# Patient Record
Sex: Female | Born: 1970 | Race: White | Hispanic: No | Marital: Married | Smoking: Never smoker
Health system: Southern US, Community
[De-identification: ages and names within clinical notes are randomized; demographics above are authoritative.]

## PROBLEM LIST (undated history)

## (undated) DIAGNOSIS — B0229 Other postherpetic nervous system involvement: Secondary | ICD-10-CM

## (undated) DIAGNOSIS — E039 Hypothyroidism, unspecified: Secondary | ICD-10-CM

## (undated) DIAGNOSIS — K2970 Gastritis, unspecified, without bleeding: Secondary | ICD-10-CM

## (undated) DIAGNOSIS — K219 Gastro-esophageal reflux disease without esophagitis: Secondary | ICD-10-CM

## (undated) DIAGNOSIS — R030 Elevated blood-pressure reading, without diagnosis of hypertension: Secondary | ICD-10-CM

## (undated) DIAGNOSIS — J309 Allergic rhinitis, unspecified: Secondary | ICD-10-CM

## (undated) DIAGNOSIS — B029 Zoster without complications: Secondary | ICD-10-CM

## (undated) DIAGNOSIS — E785 Hyperlipidemia, unspecified: Secondary | ICD-10-CM

## (undated) DIAGNOSIS — G4733 Obstructive sleep apnea (adult) (pediatric): Secondary | ICD-10-CM

## (undated) DIAGNOSIS — C73 Malignant neoplasm of thyroid gland: Secondary | ICD-10-CM

## (undated) DIAGNOSIS — R790 Abnormal level of blood mineral: Secondary | ICD-10-CM

## (undated) DIAGNOSIS — N83209 Unspecified ovarian cyst, unspecified side: Secondary | ICD-10-CM

## (undated) HISTORY — DX: Allergic rhinitis, unspecified: J30.9

## (undated) HISTORY — DX: Elevated blood-pressure reading, without diagnosis of hypertension: R03.0

## (undated) HISTORY — PX: BREAST REDUCTION SURGERY: SHX8

## (undated) HISTORY — DX: Gastritis, unspecified, without bleeding: K29.70

## (undated) HISTORY — DX: Hypothyroidism, unspecified: E03.9

## (undated) HISTORY — DX: Unspecified ovarian cyst, unspecified side: N83.209

## (undated) HISTORY — DX: Malignant neoplasm of thyroid gland: C73

## (undated) HISTORY — DX: Obstructive sleep apnea (adult) (pediatric): G47.33

## (undated) HISTORY — DX: Hyperlipidemia, unspecified: E78.5

## (undated) HISTORY — DX: Abnormal level of blood mineral: R79.0

## (undated) HISTORY — PX: COLONOSCOPY: SHX174

## (undated) HISTORY — DX: Gastro-esophageal reflux disease without esophagitis: K21.9

## (undated) HISTORY — DX: Other postherpetic nervous system involvement: B02.29

## (undated) HISTORY — DX: Zoster without complications: B02.9

---

## 2002-08-29 HISTORY — PX: THYROIDECTOMY: SHX17

## 2002-09-16 ENCOUNTER — Encounter: Payer: Self-pay | Admitting: Family Medicine

## 2004-10-06 ENCOUNTER — Other Ambulatory Visit: Admission: RE | Admit: 2004-10-06 | Discharge: 2004-10-06 | Payer: Self-pay | Admitting: Obstetrics and Gynecology

## 2004-12-12 ENCOUNTER — Encounter: Admission: RE | Admit: 2004-12-12 | Discharge: 2004-12-12 | Payer: Self-pay | Admitting: Otolaryngology

## 2005-04-09 ENCOUNTER — Encounter (HOSPITAL_COMMUNITY): Admission: RE | Admit: 2005-04-09 | Discharge: 2005-07-08 | Payer: Self-pay | Admitting: Family Medicine

## 2005-06-12 ENCOUNTER — Ambulatory Visit: Payer: Self-pay

## 2005-06-27 ENCOUNTER — Ambulatory Visit: Payer: Self-pay

## 2005-06-29 ENCOUNTER — Ambulatory Visit: Payer: Self-pay

## 2005-07-03 ENCOUNTER — Ambulatory Visit: Payer: Self-pay

## 2005-07-29 ENCOUNTER — Ambulatory Visit: Payer: Self-pay

## 2005-08-07 ENCOUNTER — Encounter: Payer: Self-pay | Admitting: Family Medicine

## 2005-08-07 ENCOUNTER — Encounter: Admission: RE | Admit: 2005-08-07 | Discharge: 2005-08-07 | Payer: Self-pay | Admitting: Obstetrics and Gynecology

## 2005-10-11 ENCOUNTER — Other Ambulatory Visit: Admission: RE | Admit: 2005-10-11 | Discharge: 2005-10-11 | Payer: Self-pay | Admitting: Obstetrics and Gynecology

## 2006-02-19 ENCOUNTER — Ambulatory Visit: Payer: Self-pay | Admitting: Internal Medicine

## 2006-03-14 ENCOUNTER — Ambulatory Visit: Payer: Self-pay

## 2006-04-15 ENCOUNTER — Ambulatory Visit: Payer: Self-pay

## 2006-05-27 ENCOUNTER — Ambulatory Visit: Payer: Self-pay

## 2006-08-16 ENCOUNTER — Ambulatory Visit: Payer: Self-pay

## 2006-10-07 IMAGING — US THYROID ULTRASOUND
1 series · 10 of 10 positions shown · non-contrast
Comparison: none

REASON FOR EXAM: thyroid CA
COMMENTS:

[Series 1: thyroid ultrasound · 10 of 10 slices shown]
[im 1/10]
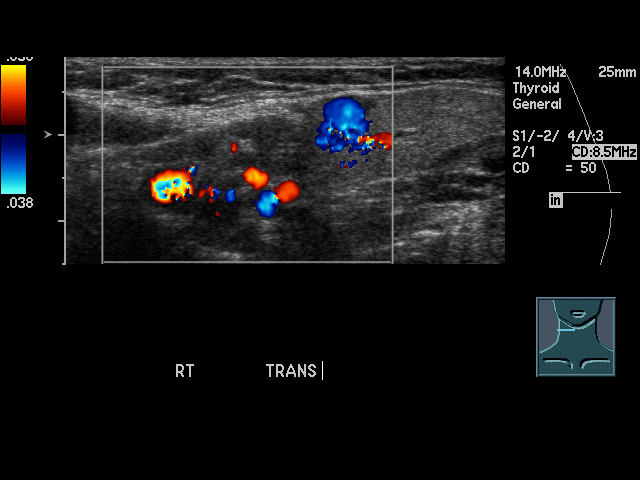
[im 2/10]
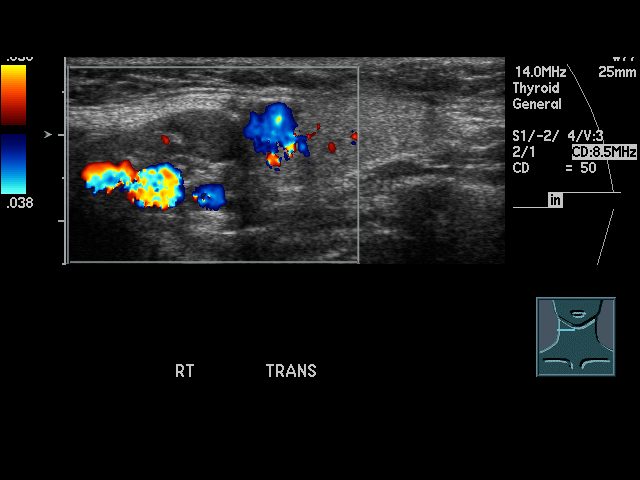
[im 3/10]
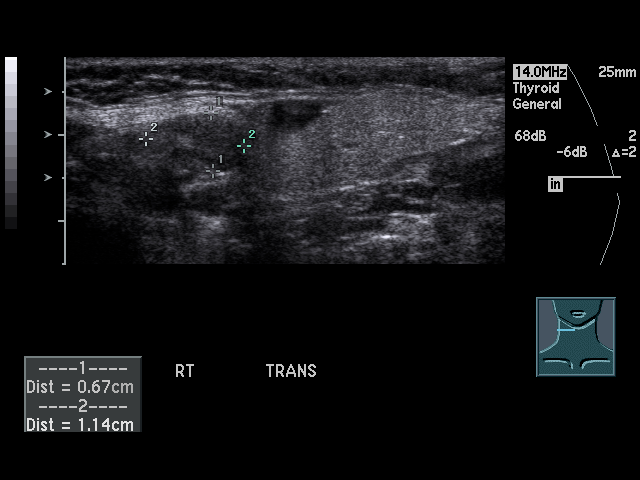
[im 4/10]
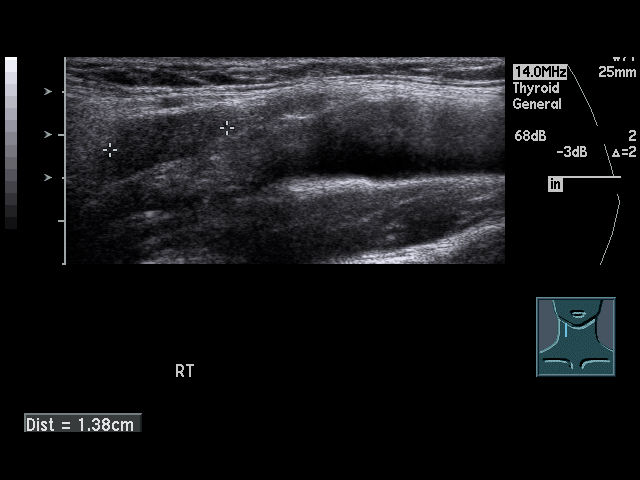
[im 5/10]
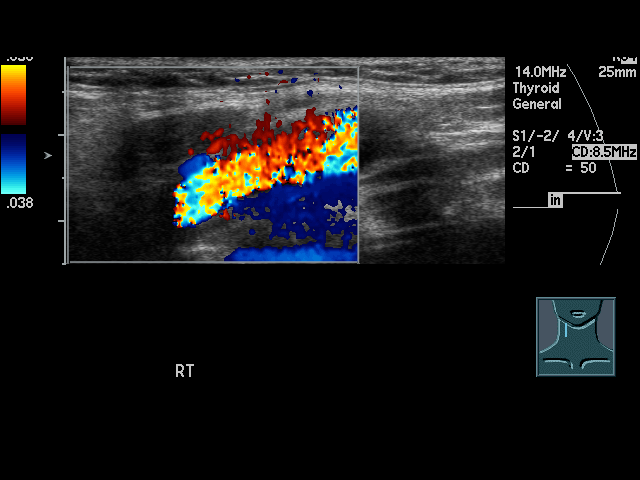
[im 6/10]
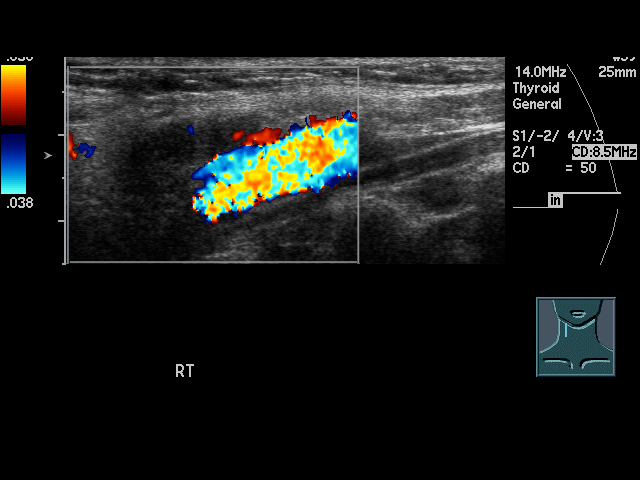
[im 7/10]
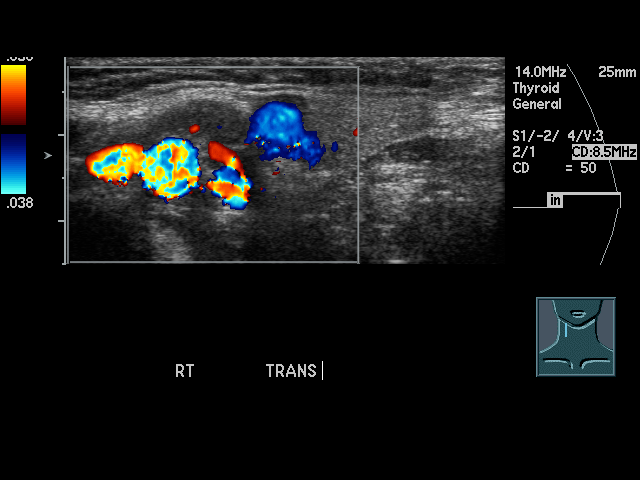
[im 8/10]
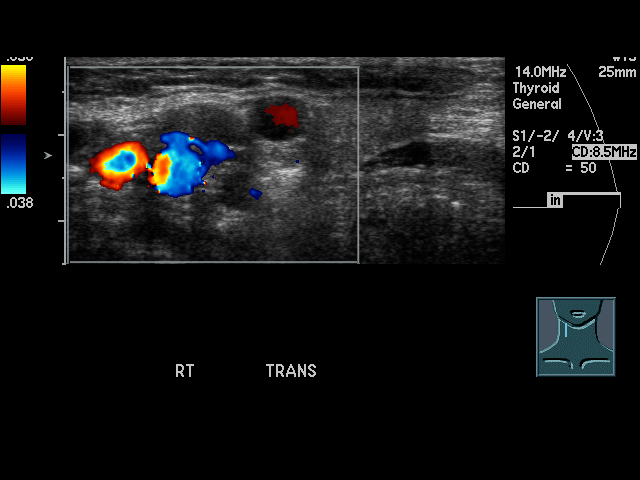
[im 9/10]
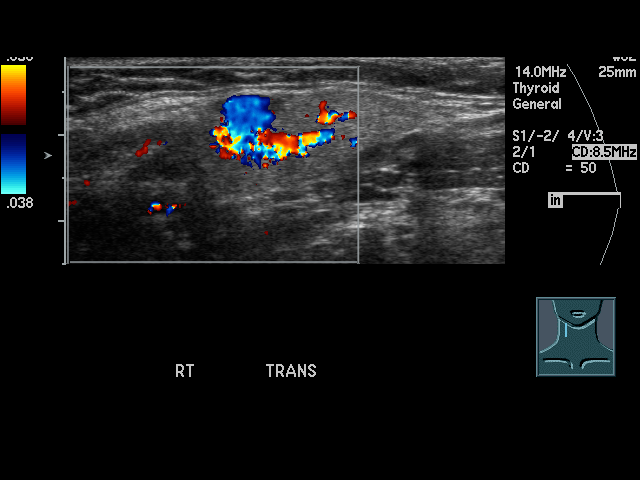
[im 10/10]
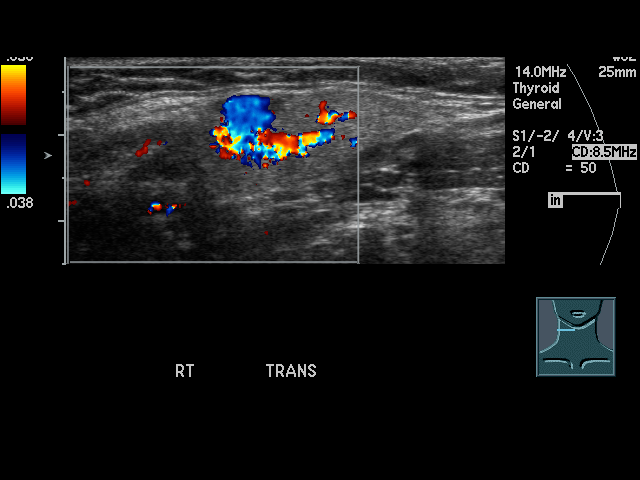

[10 of 10 positions shown; findings below may reference images not displayed]

PROCEDURE:     US  - US SOFT TISSUE HEAD/NECK  - April 15, 2006 [DATE]

RESULT:     The previously noted submandibular nodule on the RIGHT is again
seen and on this exam measures 1.38 cm x 1.14 cm x 0.67 cm.  These values
are essentially unchanged allowing for possible differences in measurement
technique in comparison to the exam June 27, 2005.  On a few views
there is noted a bright echogenic area within this nodule, which could
represent fat.  Note is made that fat was observed within the nodule at
prior CT, which would be consistent with lymph node.  The margins remain
smooth.
IMPRESSION: 1)Stable appearing submandibular nodule on the RIGHT, most likely
representing a lymph node.

## 2007-02-13 ENCOUNTER — Ambulatory Visit: Payer: Self-pay

## 2007-03-20 ENCOUNTER — Ambulatory Visit: Payer: Self-pay

## 2007-08-22 ENCOUNTER — Ambulatory Visit: Payer: Self-pay | Admitting: *Deleted

## 2008-04-08 ENCOUNTER — Encounter: Payer: Self-pay | Admitting: Family Medicine

## 2008-04-27 ENCOUNTER — Ambulatory Visit: Payer: Self-pay | Admitting: Unknown Physician Specialty

## 2008-06-29 ENCOUNTER — Ambulatory Visit: Payer: Self-pay | Admitting: Internal Medicine

## 2008-07-09 ENCOUNTER — Ambulatory Visit: Payer: Self-pay | Admitting: Internal Medicine

## 2008-07-29 ENCOUNTER — Ambulatory Visit: Payer: Self-pay | Admitting: Internal Medicine

## 2008-08-19 ENCOUNTER — Ambulatory Visit: Payer: Self-pay | Admitting: Obstetrics and Gynecology

## 2008-08-23 ENCOUNTER — Ambulatory Visit: Payer: Self-pay | Admitting: Obstetrics and Gynecology

## 2008-08-29 ENCOUNTER — Ambulatory Visit: Payer: Self-pay | Admitting: Internal Medicine

## 2008-09-28 ENCOUNTER — Ambulatory Visit: Payer: Self-pay | Admitting: Internal Medicine

## 2008-10-29 ENCOUNTER — Ambulatory Visit: Payer: Self-pay | Admitting: Internal Medicine

## 2008-11-04 ENCOUNTER — Encounter: Payer: Self-pay | Admitting: Family Medicine

## 2009-07-12 ENCOUNTER — Encounter: Payer: Self-pay | Admitting: Family Medicine

## 2009-07-12 ENCOUNTER — Ambulatory Visit: Payer: Self-pay | Admitting: Internal Medicine

## 2009-07-26 ENCOUNTER — Ambulatory Visit: Payer: Self-pay | Admitting: Family Medicine

## 2009-07-26 DIAGNOSIS — Z87898 Personal history of other specified conditions: Secondary | ICD-10-CM | POA: Insufficient documentation

## 2009-07-26 DIAGNOSIS — J309 Allergic rhinitis, unspecified: Secondary | ICD-10-CM | POA: Insufficient documentation

## 2009-07-26 DIAGNOSIS — B0229 Other postherpetic nervous system involvement: Secondary | ICD-10-CM | POA: Insufficient documentation

## 2009-07-26 DIAGNOSIS — E039 Hypothyroidism, unspecified: Secondary | ICD-10-CM | POA: Insufficient documentation

## 2009-07-26 DIAGNOSIS — C73 Malignant neoplasm of thyroid gland: Secondary | ICD-10-CM | POA: Insufficient documentation

## 2009-07-26 HISTORY — DX: Other postherpetic nervous system involvement: B02.29

## 2009-07-26 HISTORY — DX: Allergic rhinitis, unspecified: J30.9

## 2009-07-26 HISTORY — DX: Hypothyroidism, unspecified: E03.9

## 2009-07-26 HISTORY — DX: Malignant neoplasm of thyroid gland: C73

## 2009-07-29 ENCOUNTER — Ambulatory Visit: Payer: Self-pay | Admitting: Internal Medicine

## 2009-08-22 ENCOUNTER — Ambulatory Visit: Payer: Self-pay | Admitting: Family Medicine

## 2009-08-22 DIAGNOSIS — K219 Gastro-esophageal reflux disease without esophagitis: Secondary | ICD-10-CM

## 2009-08-22 DIAGNOSIS — R030 Elevated blood-pressure reading, without diagnosis of hypertension: Secondary | ICD-10-CM | POA: Insufficient documentation

## 2009-08-22 DIAGNOSIS — J019 Acute sinusitis, unspecified: Secondary | ICD-10-CM | POA: Insufficient documentation

## 2009-08-22 HISTORY — DX: Gastro-esophageal reflux disease without esophagitis: K21.9

## 2009-08-22 HISTORY — DX: Elevated blood-pressure reading, without diagnosis of hypertension: R03.0

## 2009-10-19 ENCOUNTER — Ambulatory Visit: Payer: Self-pay | Admitting: Internal Medicine

## 2009-10-29 ENCOUNTER — Ambulatory Visit: Payer: Self-pay | Admitting: Internal Medicine

## 2009-12-16 ENCOUNTER — Telehealth: Payer: Self-pay | Admitting: Family Medicine

## 2009-12-16 ENCOUNTER — Ambulatory Visit: Payer: Self-pay | Admitting: Family Medicine

## 2009-12-16 DIAGNOSIS — J1189 Influenza due to unidentified influenza virus with other manifestations: Secondary | ICD-10-CM | POA: Insufficient documentation

## 2009-12-16 LAB — CONVERTED CEMR LAB: Inflenza A Ag: POSITIVE

## 2010-07-28 ENCOUNTER — Ambulatory Visit: Payer: Self-pay | Admitting: Obstetrics and Gynecology

## 2010-09-13 ENCOUNTER — Telehealth: Payer: Self-pay | Admitting: Family Medicine

## 2010-10-06 ENCOUNTER — Emergency Department (HOSPITAL_COMMUNITY)
Admission: EM | Admit: 2010-10-06 | Discharge: 2010-10-06 | Payer: Self-pay | Source: Home / Self Care | Admitting: Emergency Medicine

## 2010-10-27 ENCOUNTER — Ambulatory Visit
Admission: RE | Admit: 2010-10-27 | Discharge: 2010-10-27 | Payer: Self-pay | Source: Home / Self Care | Attending: Family Medicine | Admitting: Family Medicine

## 2010-11-03 LAB — CONVERTED CEMR LAB
Basophils Absolute: 0 10*3/uL (ref 0.0–0.1)
Basophils Relative: 0.2 % (ref 0.0–3.0)
Eosinophils Absolute: 0.1 10*3/uL (ref 0.0–0.7)
Eosinophils Relative: 1.1 % (ref 0.0–5.0)
HCT: 38.2 % (ref 36.0–46.0)
Hemoglobin: 12.9 g/dL (ref 12.0–15.0)
Lymphocytes Relative: 29.3 % (ref 12.0–46.0)
Lymphs Abs: 2.3 10*3/uL (ref 0.7–4.0)
MCHC: 33.8 g/dL (ref 30.0–36.0)
MCV: 89.8 fL (ref 78.0–100.0)
Monocytes Absolute: 0.5 10*3/uL (ref 0.1–1.0)
Monocytes Relative: 6.7 % (ref 3.0–12.0)
Neutro Abs: 4.9 10*3/uL (ref 1.4–7.7)
Neutrophils Relative %: 62.7 % (ref 43.0–77.0)
Platelets: 285 10*3/uL (ref 150.0–400.0)
RBC: 4.26 M/uL (ref 3.87–5.11)
RDW: 13.3 % (ref 11.5–14.6)
TSH: 0.14 microintl units/mL — ABNORMAL LOW (ref 0.35–5.50)
WBC: 7.9 10*3/uL (ref 4.5–10.5)

## 2010-11-18 ENCOUNTER — Encounter (HOSPITAL_COMMUNITY): Payer: Self-pay | Admitting: Obstetrics and Gynecology

## 2010-11-19 ENCOUNTER — Encounter: Payer: Self-pay | Admitting: Family Medicine

## 2010-11-24 ENCOUNTER — Telehealth: Payer: Self-pay | Admitting: Family Medicine

## 2010-11-28 NOTE — Progress Notes (Signed)
Summary: shingles  Phone Note Call from Patient   Caller: Patient Call For: Nichole Salisbury MD Summary of Call: Pt. is asking if it is possible to have shingles x 2.  She has all the same symptoms as before, but no rash as of yet.  Would like to be seen if possible. [phone] Initial call taken by: [facility] CMA AAMA,  September 13, 2010 10:48 AM  Follow-up for Phone Call        yes it is possible to have this multiple times. work her in this afternoon  Follow-up by: Nichole Salisbury MD,  September 13, 2010 12:46 PM  Additional Follow-up for Phone Call Additional follow up Details #1::        [facility] Additional Follow-up by: [facility] CMA AAMA,  September 13, 2010 12:54 PM    Additional Follow-up for Phone Call Additional follow up Details #2::    Pt. prefers to wait and see how she does. Follow-up by: Lynann Beaver CMA AAMA,  September 13, 2010 1:43 PM

## 2010-11-28 NOTE — Assessment & Plan Note (Signed)
Summary: fever, cough   Vital Signs:  Patient profile:   40 year old female Temp:     99.0 degrees F oral BP sitting:   116 / 82  (left arm) Cuff size:   large  Vitals Entered By: Alfred Levins, CMA (December 16, 2009 9:40 AM) CC: fever, st, pressure around sinuses, cough   History of Present Illness: Here for 3 days of fevers, stuffy head, body aches, and a dry cough. No NVD. On fluids and Advil.   Current Medications (verified): 1)  Synthroid 175 Mcg Tabs (Levothyroxine Sodium) .... One By Mouth Daily 2)  Zyrtec Allergy 10 Mg Caps (Cetirizine Hcl) .... One By Mouth Daily 3)  Prevacid 30 Mg Cpdr (Lansoprazole) .... One By Mouth Daily  Allergies (verified): No Known Drug Allergies  Past History:  Past Medical History: Reviewed history from 07/26/2009 and no changes required. thyroid cancer, papillary carcinoma Hypothyroidism, sees Dr. Dorisann Frames chickenpox colon polyps sees Dr. Zelphia Cairo for GYN care Allergic rhinitis vitamin D deficiency, treated in 2010  Review of Systems  The patient denies anorexia, weight loss, weight gain, vision loss, decreased hearing, hoarseness, chest pain, syncope, dyspnea on exertion, peripheral edema, hemoptysis, abdominal pain, melena, hematochezia, severe indigestion/heartburn, hematuria, incontinence, genital sores, muscle weakness, suspicious skin lesions, transient blindness, difficulty walking, depression, unusual weight change, abnormal bleeding, enlarged lymph nodes, angioedema, breast masses, and testicular masses.    Physical Exam  General:  Well-developed,well-nourished,in no acute distress; alert,appropriate and cooperative throughout examination Head:  Normocephalic and atraumatic without obvious abnormalities. No apparent alopecia or balding. Eyes:  No corneal or conjunctival inflammation noted. EOMI. Perrla. Funduscopic exam benign, without hemorrhages, exudates or papilledema. Vision grossly normal. Ears:  External  ear exam shows no significant lesions or deformities.  Otoscopic examination reveals clear canals, tympanic membranes are intact bilaterally without bulging, retraction, inflammation or discharge. Hearing is grossly normal bilaterally. Nose:  External nasal examination shows no deformity or inflammation. Nasal mucosa are pink and moist without lesions or exudates. Mouth:  Oral mucosa and oropharynx without lesions or exudates.  Teeth in good repair. Neck:  No deformities, masses, or tenderness noted. Lungs:  Normal respiratory effort, chest expands symmetrically. Lungs are clear to auscultation, no crackles or wheezes.   Impression & Recommendations:  Problem # 1:  INFLUENZA (ICD-487.8)  Complete Medication List: 1)  Synthroid 175 Mcg Tabs (Levothyroxine sodium) .... One by mouth daily 2)  Zyrtec Allergy 10 Mg Caps (Cetirizine hcl) .... One by mouth daily 3)  Prevacid 30 Mg Cpdr (Lansoprazole) .... One by mouth daily 4)  Tamiflu 75 Mg Caps (Oseltamivir phosphate) .... Two times a day  Other Orders: Rapid Strep (19147) Flu A+B (82956)  Patient Instructions: 1)  Add Tamiflu.  2)  Please schedule a follow-up appointment as needed .  Prescriptions: TAMIFLU 75 MG CAPS (OSELTAMIVIR PHOSPHATE) two times a day  #10 x 0   Entered and Authorized by:   Nelwyn Salisbury MD   Signed by:   Nelwyn Salisbury MD on 12/16/2009   Method used:   Electronically to        [facility] [address] [phone]* (retail)       [address]       [place], [place]  [address]       Ph: [phone] or [phone]       Fax: [phone]   RxID:   [id number]   Laboratory Results  Other Tests  Rapid Strep: negative Influenza: positive Comments Wynona Canes, CMA  December 16, 2009 9:54 AM   Kit Test Internal QC: Negative   (Normal Range: Negative)

## 2010-11-28 NOTE — Progress Notes (Signed)
  Phone Note Call from Patient   Caller: Patient Call For: Nelwyn Salisbury MD Summary of Call: sick with fever 101.7, cough, wants to be seen. Initial call taken by: Raechel Ache, RN,  December 16, 2009 8:17 AM  Follow-up for Phone Call        appt this morning Dr Clent Ridges Follow-up by: Raechel Ache, RN,  December 16, 2009 8:17 AM

## 2010-11-30 NOTE — Progress Notes (Signed)
Summary: rx flagyl   Phone Note Call from Patient Call back at Home Phone [phone]   Caller: Patient Call For: Nelwyn Salisbury MD Summary of Call: Pt had GI symptoms on Keflex with cramping and diarrhea.  Stopped it yesterday am, and wants to know how long it will take her to feel better?  Initial call taken by: Lynann Beaver CMA AAMA,  November 24, 2010 9:50 AM  Follow-up for Phone Call        this should be gone by tomorrow. Why did she take Keflex?  Follow-up by: Nelwyn Salisbury MD,  November 24, 2010 1:37 PM  Additional Follow-up for Phone Call Additional follow up Details #1::        Has a friend that is a NP and she had a hang nail that she pulled off of her ring finger and it got infected, pt is concerned about getting cdiff.  She is taking imodium and its helping but she is still having pain.  Wants to call know if its ok with you if she has any problems then she can call you at home this weekend Additional Follow-up by: Alfred Levins, CMA,  November 24, 2010 3:42 PM    Additional Follow-up for Phone Call Additional follow up Details #2::    there is a real possibility of C. diff. so I would advise her to go ahead be treated for this now rather than later. Call in Flagyl 500 mg to take three times a day for 10 days.  Follow-up by: Nelwyn Salisbury MD,  November 24, 2010 4:57 PM  Additional Follow-up for Phone Call Additional follow up Details #3:: Details for Additional Follow-up Action Taken: spoke to pt  [facility] Additional Follow-up by: Pura Spice, RN,  November 24, 2010 4:59 PM  New/Updated Medications: FLAGYL 500 MG TABS (METRONIDAZOLE) 1 by mouth three times a day for 10 days Prescriptions: FLAGYL 500 MG TABS (METRONIDAZOLE) 1 by mouth three times a day for 10 days  #30 x 0   Entered by:   Pura Spice, RN   Authorized by:   Nelwyn Salisbury MD   Signed by:   Pura Spice, RN on 11/24/2010   Method used:   Electronically to        [facility]  [phone]* (retail)       [address]       [place], [place]  [address]       Ph: [phone] or [phone]       Fax: [phone]   RxID:   [id number]

## 2010-11-30 NOTE — Assessment & Plan Note (Signed)
Summary: flu shot//ccm  Nurse Visit   Review of Systems       Flu Vaccine Consent Questions     Do you have a history of severe allergic reactions to this vaccine? no    Any prior history of allergic reactions to egg and/or gelatin? no    Do you have a sensitivity to the preservative Thimersol? no    Do you have a past history of Guillan-Barre Syndrome? no    Do you currently have an acute febrile illness? no    Have you ever had a severe reaction to latex? no    Vaccine information given and explained to patient? yes    Are you currently pregnant? no    Lot Number:[id number]   Exp Date:04/28/2011   Site Given  Left Deltoid IM  sanofi pasteur Pura Spice, RN  October 27, 2010 2:59 PM     Allergies: No Known Drug Allergies  Orders Added: 1)  Admin 1st Vaccine [90471] 2)  Flu Vaccine 27yrs + [06301]  Appended Document: Orders Update    Clinical Lists Changes  Orders: Added new Service order of Venipuncture (60109) - Signed Added new Service order of Specimen Handling (32355) - Signed Added new Test order of TLB-CBC Platelet - w/Differential (85025-CBCD) - Signed Added new Test order of TLB-TSH (Thyroid Stimulating Hormone) (84443-TSH) - Signed

## 2010-12-28 ENCOUNTER — Ambulatory Visit: Payer: Self-pay | Admitting: Internal Medicine

## 2011-01-04 ENCOUNTER — Encounter: Payer: Self-pay | Admitting: Family Medicine

## 2011-01-05 ENCOUNTER — Ambulatory Visit (INDEPENDENT_AMBULATORY_CARE_PROVIDER_SITE_OTHER): Payer: PRIVATE HEALTH INSURANCE | Admitting: Family Medicine

## 2011-01-05 ENCOUNTER — Encounter: Payer: Self-pay | Admitting: Family Medicine

## 2011-01-05 VITALS — BP 104/70 | Temp 98.4°F | Ht 66.0 in

## 2011-01-05 DIAGNOSIS — E039 Hypothyroidism, unspecified: Secondary | ICD-10-CM

## 2011-01-05 DIAGNOSIS — F909 Attention-deficit hyperactivity disorder, unspecified type: Secondary | ICD-10-CM

## 2011-01-05 NOTE — Progress Notes (Signed)
  Subjective:    Patient ID: Nichole Flores, female    DOB: 04/03/1971, 40 y.o.   MRN: [id number]  HPI Here to discuss several issues. First she has been seeing Dr. Talmage Nap to regulate her thyroid levels, and some recent labs showed her TSH to be suppressed a bit at 0.096. She thinks her Synthroid dose may be too high. Over the past few months she has had occasional brief episodes of lightheadedness which pass after 5-10 seconds. No vertigo. These are not associated with any other symptoms, nor with exercise. She takes spinning classes several days a week, and these spells never bother her then. No SOB or chest pains or palpitations. Also, she has had some mild problems with focus over the years, and they have been a bit more pronounced lately. She tends to go from task to task quickly, but she always goes back to finish her unfinished tasks. She manages her activities well. She has learned that her mother and her mother's mother probably had ADHD from the family's descriptions.    Review of Systems  Constitutional: Negative.   HENT: Negative.   Eyes: Negative.   Respiratory: Negative.   Cardiovascular: Negative.   Neurological: Positive for light-headedness. Negative for tremors, syncope and weakness.  Psychiatric/Behavioral: Negative.        Objective:   Physical Exam  Constitutional: She is oriented to person, place, and time. She appears well-developed and well-nourished.  Eyes: Conjunctivae are normal. Pupils are equal, round, and reactive to light.  Neck: No thyromegaly present.  Cardiovascular: Normal rate, regular rhythm, normal heart sounds and intact distal pulses.   Pulmonary/Chest: Effort normal and breath sounds normal.  Lymphadenopathy:    She has no cervical adenopathy.  Neurological: She is alert and oriented to person, place, and time. No cranial nerve deficit. She exhibits normal muscle tone. Coordination normal.  Psychiatric: She has a normal mood and affect. Her  behavior is normal. Judgment and thought content normal.          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 Her spells of lightheadedness may in fact be related to overtreatment with thyroid meds. Advised her to see Dr. Talmage Nap soon to adjust the dose.

## 2011-01-28 ENCOUNTER — Ambulatory Visit: Payer: Self-pay | Admitting: Internal Medicine

## 2011-02-20 ENCOUNTER — Encounter: Payer: Self-pay | Admitting: Family Medicine

## 2011-03-02 ENCOUNTER — Encounter: Payer: Self-pay | Admitting: Family Medicine

## 2011-03-02 ENCOUNTER — Ambulatory Visit (INDEPENDENT_AMBULATORY_CARE_PROVIDER_SITE_OTHER): Payer: PRIVATE HEALTH INSURANCE | Admitting: Family Medicine

## 2011-03-02 VITALS — BP 118/62 | HR 62 | Temp 98.3°F

## 2011-03-02 DIAGNOSIS — J029 Acute pharyngitis, unspecified: Secondary | ICD-10-CM

## 2011-03-02 DIAGNOSIS — J069 Acute upper respiratory infection, unspecified: Secondary | ICD-10-CM

## 2011-03-02 DIAGNOSIS — M542 Cervicalgia: Secondary | ICD-10-CM

## 2011-03-02 LAB — POCT RAPID STREP A (OFFICE): Rapid Strep A Screen: NEGATIVE

## 2011-03-02 NOTE — Progress Notes (Signed)
  Subjective:    Patient ID: Nichole Flores, female    DOB: 1971/02/05, 40 y.o.   MRN: [id number]  HPI Here for 2 things. First she has had tightness and mild pain in the neck and over the tops of both shoulders for 4 days. She has been doing some pilates workouts lately. Then yesterday she developed a mild ST. No HA or fever or cough.   Review of Systems  Constitutional: Negative.   HENT: Positive for sore throat. Negative for congestion, postnasal drip and sinus pressure.   Eyes: Negative.   Respiratory: Negative.   Musculoskeletal: Positive for back pain.       Objective:   Physical Exam  Constitutional: She appears well-developed and well-nourished. No distress.  HENT:  Right Ear: External ear normal.  Left Ear: External ear normal.  Nose: Nose normal.  Mouth/Throat: Oropharynx is clear and moist. No oropharyngeal exudate.  Eyes: Conjunctivae are normal. Pupils are equal, round, and reactive to light.  Neck: Normal range of motion. Neck supple. No thyromegaly present.       Mildly tender in the posterior neck   Pulmonary/Chest: Effort normal and breath sounds normal.  Musculoskeletal:       Tender over the upper back and both upper trapezei. Full ROM  Lymphadenopathy:    She has no cervical adenopathy.          Assessment & Plan:  She has a viral URI. Use rest, fluids, and Motrin. She strained her neck from working out, so she will rest a few days. Motrin and heat will help

## 2011-03-21 ENCOUNTER — Ambulatory Visit (INDEPENDENT_AMBULATORY_CARE_PROVIDER_SITE_OTHER): Payer: PRIVATE HEALTH INSURANCE | Admitting: Family Medicine

## 2011-03-21 ENCOUNTER — Encounter: Payer: Self-pay | Admitting: Family Medicine

## 2011-03-21 VITALS — BP 122/84 | HR 73 | Temp 98.6°F

## 2011-03-21 DIAGNOSIS — W57XXXA Bitten or stung by nonvenomous insect and other nonvenomous arthropods, initial encounter: Secondary | ICD-10-CM

## 2011-03-21 DIAGNOSIS — T148 Other injury of unspecified body region: Secondary | ICD-10-CM

## 2011-03-21 DIAGNOSIS — R070 Pain in throat: Secondary | ICD-10-CM

## 2011-03-21 MED ORDER — AMOXICILLIN 875 MG PO TABS: 875.0000 mg | ORAL_TABLET | Freq: Two times a day (BID) | ORAL | Status: AC

## 2011-03-21 NOTE — Progress Notes (Signed)
  Subjective:    Patient ID: Nichole Flores, female    DOB: [birth year], 40 y.o.   MRN: [id number]  HPI Here to check a tick bite and to ask about some intermittent throat problems she has had for a month or so. She discovered a tick on her left shoulder last night and pulled it out with tweezers. She feels fine otherwise with no rashes or fever or HA or joint pains. Also she has had episodes of a sharp pain in the throat at times which only lasts a few seconds at a time. Occasionally she feels choked and has to clear her throat, but there is no PND. No ST. No coughing or trouble swallowing.    Review of Systems  Constitutional: Negative.   HENT: Negative.   Respiratory: Negative.   Cardiovascular: Negative.   Skin: Positive for wound.       Objective:   Physical Exam  Constitutional: She appears well-developed and well-nourished.  HENT:  Nose: Nose normal.  Mouth/Throat: Oropharynx is clear and moist. No oropharyngeal exudate.  Neck: Normal range of motion. Neck supple. No thyromegaly present.  Lymphadenopathy:    She has no cervical adenopathy.  Skin:       There is a tiny red spot on top of the left shoulder           Assessment & Plan:  Cover the tick bite with Amoxicillin. The throat symptoms may be from GERD so try Prilosec OTC daily.

## 2011-04-12 ENCOUNTER — Ambulatory Visit: Payer: Self-pay | Admitting: Unknown Physician Specialty

## 2011-04-12 DIAGNOSIS — K635 Polyp of colon: Secondary | ICD-10-CM | POA: Insufficient documentation

## 2011-04-25 ENCOUNTER — Ambulatory Visit: Payer: Self-pay | Admitting: Internal Medicine

## 2011-05-23 ENCOUNTER — Encounter: Payer: Self-pay | Admitting: Family Medicine

## 2011-07-18 ENCOUNTER — Telehealth: Payer: Self-pay | Admitting: *Deleted

## 2011-07-18 NOTE — Telephone Encounter (Addendum)
Pt is complaining abdominal burning, and pain.  Feels almost like the shingles pain 4 years ago.  Would like Dr. Claris Che advice before she makes an appt.  Asking for RX if Dr. Clent Ridges agrees that it could be another episode of shingles.  Today the pain feels like a band around her waist.  NO RASH.

## 2011-07-18 NOTE — Telephone Encounter (Signed)
It is hard to say over the phone. She needs an OV for me to check it out

## 2011-07-19 ENCOUNTER — Encounter: Payer: Self-pay | Admitting: Family Medicine

## 2011-07-19 ENCOUNTER — Ambulatory Visit (INDEPENDENT_AMBULATORY_CARE_PROVIDER_SITE_OTHER): Payer: BC Managed Care – PPO | Admitting: Family Medicine

## 2011-07-19 VITALS — BP 120/80 | HR 76 | Temp 99.2°F

## 2011-07-19 DIAGNOSIS — F411 Generalized anxiety disorder: Secondary | ICD-10-CM

## 2011-07-19 DIAGNOSIS — R1013 Epigastric pain: Secondary | ICD-10-CM

## 2011-07-19 DIAGNOSIS — F419 Anxiety disorder, unspecified: Secondary | ICD-10-CM

## 2011-07-19 MED ORDER — OMEPRAZOLE 40 MG PO CPDR: 40.0000 mg | DELAYED_RELEASE_CAPSULE | Freq: Every day | ORAL | Status: DC

## 2011-07-19 MED ORDER — CITALOPRAM HYDROBROMIDE 10 MG PO TABS: 10.0000 mg | ORAL_TABLET | Freq: Every day | ORAL | Status: DC

## 2011-07-19 NOTE — Progress Notes (Signed)
  Subjective:    Patient ID: Nichole Flores, female    DOB: 1971-10-07, 40 y.o.   MRN: [id number]  HPI Here for several reasons. First she has had intermittent mid abdominal pains for about 2 weeks. These can be moderately severe but most of the time they are mild. She describes them as a dull aching pain which is centered in the epigastrium but which can radiate through to the back. No heartburn or fever or nausea. The pains do not seem to be related to eating food. No change in urinations or BMs. Also, she has been under a lot of stress for the past 6 months or so, and she feels anxious all the time. She worries about things and can't relax. She sleeps well most of the time. She denies any depression symptoms. Her sister and mother have been treated for anxiety for many years.    Review of Systems  Constitutional: Negative.   Respiratory: Negative.   Cardiovascular: Negative.   Gastrointestinal: Positive for abdominal pain. Negative for nausea, vomiting, diarrhea, constipation, blood in stool and abdominal distention.  Genitourinary: Negative.   Psychiatric/Behavioral: Negative for confusion, dysphoric mood and agitation. The patient is nervous/anxious.        Objective:   Physical Exam  Constitutional: She appears well-developed and well-nourished.  Cardiovascular: Normal rate, regular rhythm, normal heart sounds and intact distal pulses.   Pulmonary/Chest: Effort normal and breath sounds normal.  Abdominal: Soft. Bowel sounds are normal. She exhibits no distension and no mass. There is no tenderness. There is no rebound and no guarding.  Psychiatric: She has a normal mood and affect. Her behavior is normal. Thought content normal.          Assessment & Plan:  She probably has some duodenitis, possibly as early ulcer. Try Omeprazole daily. Start on celexa for the anxiety. Recheck in 2 weeks

## 2011-07-22 ENCOUNTER — Emergency Department (HOSPITAL_COMMUNITY): Payer: BC Managed Care – PPO

## 2011-07-22 ENCOUNTER — Emergency Department (HOSPITAL_COMMUNITY)
Admission: EM | Admit: 2011-07-22 | Discharge: 2011-07-22 | Disposition: A | Discharge status: Home / Self Care | Payer: BC Managed Care – PPO | Attending: Emergency Medicine | Admitting: Emergency Medicine

## 2011-07-22 DIAGNOSIS — R110 Nausea: Secondary | ICD-10-CM | POA: Insufficient documentation

## 2011-07-22 DIAGNOSIS — K7689 Other specified diseases of liver: Secondary | ICD-10-CM | POA: Insufficient documentation

## 2011-07-22 DIAGNOSIS — R1013 Epigastric pain: Secondary | ICD-10-CM | POA: Insufficient documentation

## 2011-07-22 LAB — CBC
Hemoglobin: 12.8 g/dL (ref 12.0–15.0)
MCH: 29.7 pg (ref 26.0–34.0)
MCHC: 34 g/dL (ref 30.0–36.0)
MCV: 87.5 fL (ref 78.0–100.0)
Platelets: 278 10*3/uL (ref 150–400)
RBC: 4.31 MIL/uL (ref 3.87–5.11)
RDW: 12.7 % (ref 11.5–15.5)

## 2011-07-22 LAB — COMPREHENSIVE METABOLIC PANEL
AST: 17 U/L (ref 0–37)
Alkaline Phosphatase: 41 U/L (ref 39–117)
BUN: 8 mg/dL (ref 6–23)
CO2: 28 mEq/L (ref 19–32)
Calcium: 9.1 mg/dL (ref 8.4–10.5)
Chloride: 105 mEq/L (ref 96–112)
Creatinine, Ser: 0.6 mg/dL (ref 0.50–1.10)
GFR calc Af Amer: >60 mL/min (ref >60)
GFR calc non Af Amer: >60 mL/min (ref >60)
Glucose, Bld: 110 mg/dL — ABNORMAL HIGH (ref 70–99)
Total Protein: 7 g/dL (ref 6.0–8.3)

## 2011-07-22 LAB — DIFFERENTIAL
Eosinophils Absolute: 0.4 10*3/uL (ref 0.0–0.7)
Eosinophils Relative: 5 % (ref 0–5)
Lymphs Abs: 2.1 10*3/uL (ref 0.7–4.0)
Monocytes Absolute: 0.6 10*3/uL (ref 0.1–1.0)
Monocytes Relative: 8 % (ref 3–12)
Neutrophils Relative %: 58 % (ref 43–77)

## 2011-07-22 LAB — LIPASE, BLOOD: Lipase: 53 U/L (ref 11–59)

## 2011-07-22 LAB — POCT I-STAT TROPONIN I: Troponin i, poc: 0 ng/mL (ref 0.00–0.08)

## 2011-07-23 ENCOUNTER — Telehealth: Payer: Self-pay | Admitting: *Deleted

## 2011-07-23 NOTE — Telephone Encounter (Signed)
[facility] Triage Call Report Triage Record Num: [id number] Operator: Lyn Hollingshead Patient Name: Milianna Ericsson Call Date & Time: 07/22/2011 9:27:56AM Patient Phone: [phone] PCP: Tera Mater. Clent Ridges Patient Gender: Female PCP Fax : [phone] Patient DOB: [birth year] [facility] Name[facility] Reason for Call: Pt has called twice this weekend due to pain from Duodenal Ulcer. Pt actually went to ER last night. She was given Percocet and Zofran. Pt states she wants something else to tx the actual Ulcers. She states Omeprazole is not working. Pt is requesting a rx such as Carofate. Pt states she has also tried Tums, and Gaviscon trying to coat the Ulcers. Pt denies any changes in her assessment from yesterday, just the continued episodes of pain. She rates her pain right now at a 5 but an hour ago it was a 9.Dr. Clent Ridges paged. Per Dr. Clent Ridges pt to increase Omeprazole to twice per day on an empty stomach before breakfast and before dinner. Per Dr. Clent Ridges Carafate 1gm po 4x per day #30 called to [facility] at [facility] [phone]. Protocol(s) Used: Office Note Recommended Outcome per Protocol: Information Noted and Sent to Office Reason for Outcome: Caller information to office Care Advice: ~ 07/22/2011 69:62:95MW Page 1 of 1 CAN_TriageRpt_V2

## 2011-07-23 NOTE — Telephone Encounter (Signed)
[facility] Triage Call Report Triage Record Num: [id number] Operator: Meribeth Mattes Patient Name: Nichole Flores Call Date & Time: 07/22/2011 1:19:41AM Patient Phone: [phone] PCP: Tera Mater. Clent Ridges Patient Gender: Female PCP Fax : [phone] Patient DOB: 03-10-1971 [facility] Name: [facility] Reason for Call: RN called pt back and pt states that she went on to the ER, is currently at the ER. Protocol(s) Used: Office Note Recommended Outcome per Protocol: Information Noted and Sent to Office Reason for Outcome: Caller information to office Care Advice: ~ 07/22/2011 1:21:48AM Page 1 of 1 CAN_TriageRpt_V2

## 2011-07-23 NOTE — Telephone Encounter (Signed)
noted 

## 2011-07-23 NOTE — Telephone Encounter (Signed)
[facility] Triage Call Report Triage Record Num: [id number] Operator: Lyn Hollingshead Patient Name: Nichole Flores Call Date & Time: 07/21/2011 10:50:24AM Patient Phone: [phone] PCP: Tera Mater. Clent Ridges Patient Gender: Female PCP Fax : [phone] Patient DOB: 05/06/1971 [facility] Name: [facility] Reason for Call: Onset-4 weeks ago. Pt had a doctor visit on Thursday 07/19/11 and dx with a Duodenal Ulcer. She is using Omeprazole 40mg  once a day. States that at night she is waking up in pain. She is not currently having this discomfort. Emergent s/s of Abdominal pain r/o. Home care advice given. Recommended pt try OTC Tylenol for discomfort. Pt to call back if she has severe pain like she did last night. Protocol(s) Used: Abdominal Pain Recommended Outcome per Protocol: See Provider within 72 Hours Reason for Outcome: All other situations Care Advice: Do not take aspirin, ibuprofen, ketoprofen, naproxen, etc., or other pain relieving medications until consulting with provider. ~ ~ Call provider if symptoms do not improve or symptoms worsen after trying home care measures. Talk to provider immediately if having repeated episodes pain/discomfort in lower chest or back; indigestion not relieved with antacids; shortness of breath without chest pain; or unexplained fatigue, weakness or anxiety. ~ ~ SYMPTOM / CONDITION MANAGEMENT ~ CAUTIONS Consider the use of an nonprescription antacid (such as Maalox, Mylanta, Riopan, Gelusil or Gaviscon) or a histamine antagonist (such as Tagamet, Zantac, Pepcid or Axid) as directed by the product label or a pharmacist and if not contraindicated by existing conditions. ~ Heartburn Relief (Dietary): - Avoid overeating. - Eat smaller, more frequent meals and chew each bite thoroughly. - Avoid high fat, spicy or gas-producing foods. - Limit liquids/foods that are gastric irritants (fruit juices, caffeinated, carbonated or alcoholic beverages,  chocolate and dairy products). - Eat at least three hours before bedtime. ~ 07/21/2011 11:18:34AM Page 1 of 1 CAN_TriageRpt_V2

## 2011-07-25 ENCOUNTER — Telehealth: Payer: Self-pay | Admitting: *Deleted

## 2011-07-25 NOTE — Telephone Encounter (Signed)
Pt had to go to the ER this weekend with increasing pain from her duodenal ulcer?  She is now taking 80 mg of Omeprazole and Carafate qid, with Celexa 10 mg. Daily.  She is feeling better during the day, but still having some pain at night.  What she is concerned about, is that she feels like she is having anxiety attacks at night.  Feels like rapid heart beat, or extreme anxiousness.  Is asking what Dr. Clent Ridges thinks this may be.   Pharmacist suggests low magnesium from the Prilosec?   She wonders about the Celexa.  She sleeps vertically every night.

## 2011-07-26 MED ORDER — ALPRAZOLAM 0.5 MG PO TABS: 0.5000 mg | ORAL_TABLET | Freq: Every evening | ORAL | Status: AC | PRN

## 2011-07-26 NOTE — Telephone Encounter (Signed)
Pt's rx has been called in to the pharmacy.  Left a message for pt to return call

## 2011-07-26 NOTE — Telephone Encounter (Signed)
It sounds like she is simply having a lot of anxiety. She is on a very low dose of Celexa, and this probably needs to be increased. For now, I would suggest taking an med at bedtime to help her relax. Call in Xanax 0.5 mg to take at bedtime, #30 with 2 rf

## 2011-07-27 NOTE — Telephone Encounter (Signed)
I spoke with pt and she said that the Omeprazole was interacting with the Celexa. She does not need the new script.

## 2011-07-30 ENCOUNTER — Other Ambulatory Visit: Payer: Self-pay

## 2011-07-31 ENCOUNTER — Ambulatory Visit: Payer: Self-pay | Admitting: Unknown Physician Specialty

## 2011-08-17 ENCOUNTER — Ambulatory Visit: Payer: Self-pay | Admitting: Obstetrics and Gynecology

## 2011-08-27 ENCOUNTER — Ambulatory Visit: Payer: Self-pay | Admitting: Unknown Physician Specialty

## 2011-10-17 ENCOUNTER — Ambulatory Visit: Payer: Self-pay | Admitting: Internal Medicine

## 2011-10-30 ENCOUNTER — Ambulatory Visit: Payer: Self-pay | Admitting: Internal Medicine

## 2011-12-12 ENCOUNTER — Ambulatory Visit: Payer: Self-pay | Admitting: Internal Medicine

## 2011-12-12 LAB — CBC WITH DIFFERENTIAL/PLATELET
Basophil #: 0 10*3/uL (ref 0.0–0.1)
Basophil %: 0.5 %
HCT: 37.9 % (ref 35.0–47.0)
HGB: 12.8 g/dL (ref 12.0–16.0)
Lymphocyte %: 41.2 %
MCHC: 33.8 g/dL (ref 32.0–36.0)
Monocyte #: 0.4 10*3/uL (ref 0.0–0.7)
Monocyte %: 7.6 %
Neutrophil #: 2.3 10*3/uL (ref 1.4–6.5)
Neutrophil %: 49.1 %
Platelet: 262 10*3/uL (ref 150–440)
RDW: 13.8 % (ref 11.5–14.5)
WBC: 4.6 10*3/uL (ref 3.6–11.0)

## 2011-12-28 ENCOUNTER — Ambulatory Visit: Payer: Self-pay | Admitting: Internal Medicine

## 2012-05-19 ENCOUNTER — Ambulatory Visit: Payer: Self-pay | Admitting: Internal Medicine

## 2012-05-29 ENCOUNTER — Ambulatory Visit: Payer: Self-pay | Admitting: Internal Medicine

## 2012-06-10 ENCOUNTER — Ambulatory Visit: Payer: Self-pay | Admitting: Obstetrics and Gynecology

## 2012-07-31 ENCOUNTER — Other Ambulatory Visit: Payer: Self-pay | Admitting: Internal Medicine

## 2012-07-31 DIAGNOSIS — R1011 Right upper quadrant pain: Secondary | ICD-10-CM

## 2012-08-05 ENCOUNTER — Ambulatory Visit
Admission: RE | Admit: 2012-08-05 | Discharge: 2012-08-05 | Disposition: A | Discharge status: Home / Self Care | Payer: PRIVATE HEALTH INSURANCE | Source: Ambulatory Visit | Attending: Internal Medicine | Admitting: Internal Medicine

## 2012-08-05 DIAGNOSIS — R1011 Right upper quadrant pain: Secondary | ICD-10-CM

## 2012-08-18 ENCOUNTER — Ambulatory Visit: Payer: Self-pay | Admitting: Obstetrics and Gynecology

## 2012-12-19 ENCOUNTER — Ambulatory Visit: Payer: Self-pay | Admitting: Internal Medicine

## 2013-01-27 IMAGING — US US ABDOMEN COMPLETE
1 series · 14 of 25 positions shown · non-contrast
Comparison: 07/22/2011

CLINICAL DATA: Right upper quadrant pain.

COMPLETE ABDOMINAL ULTRASOUND

[Series 1: us abdomen complete · 0.24mm/px · 14 of 83 slices shown]
[im 1/83]
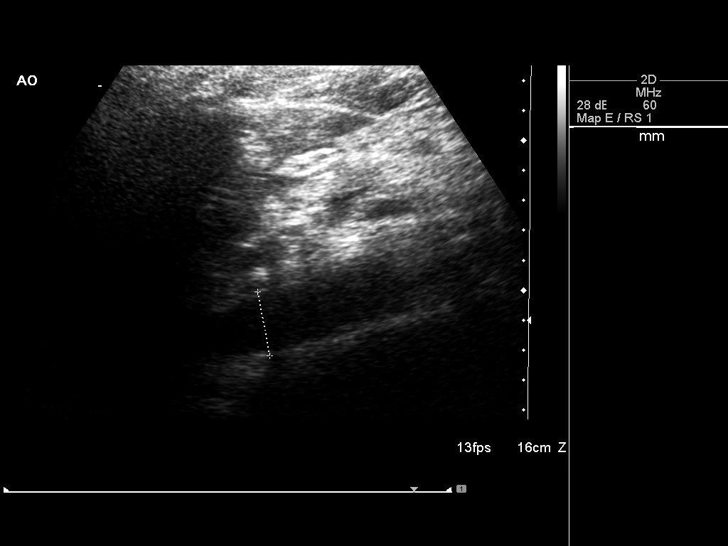
[im 7/83]
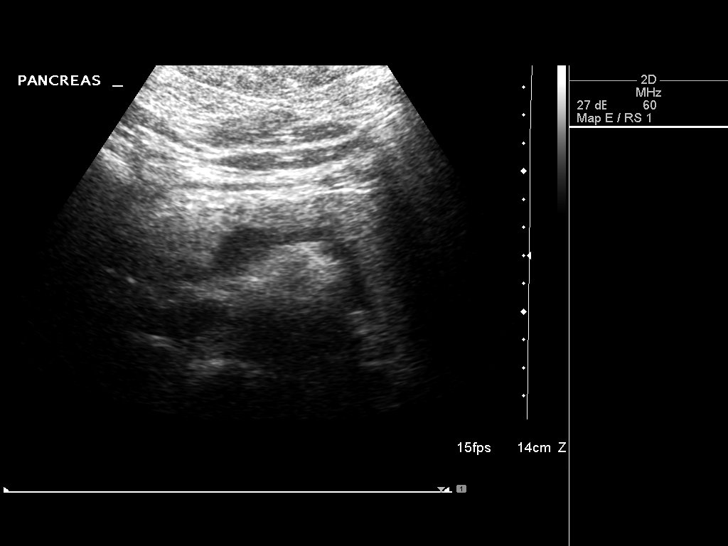
[im 14/83]
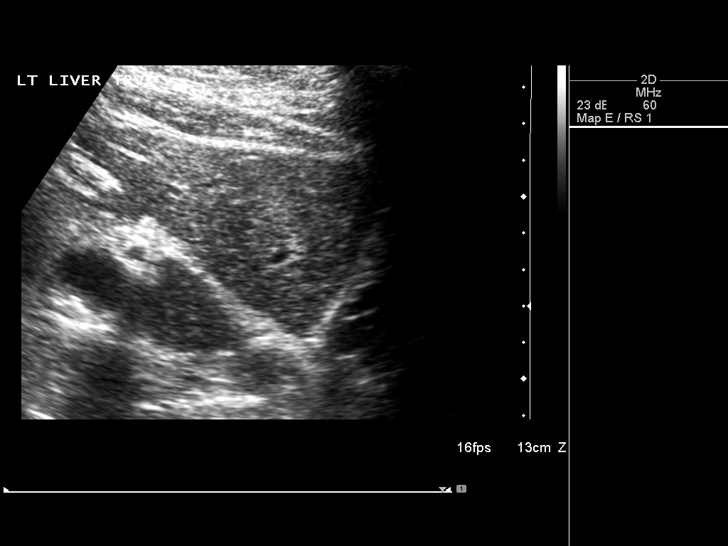
[im 21/83]
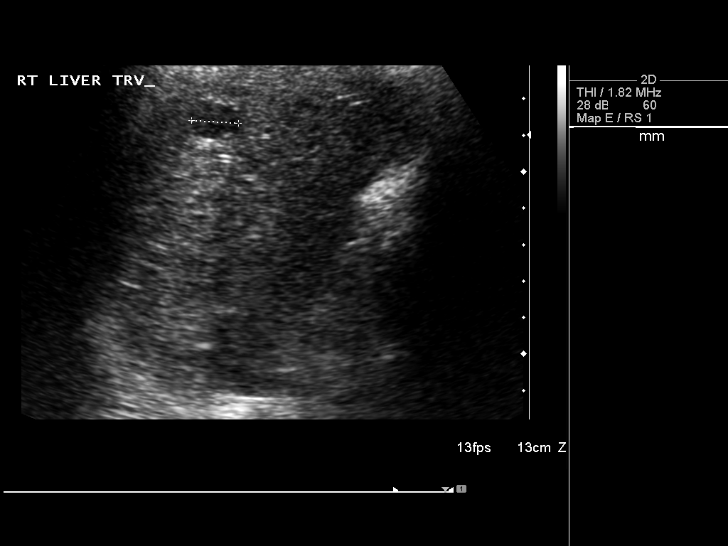
[im 28/83]
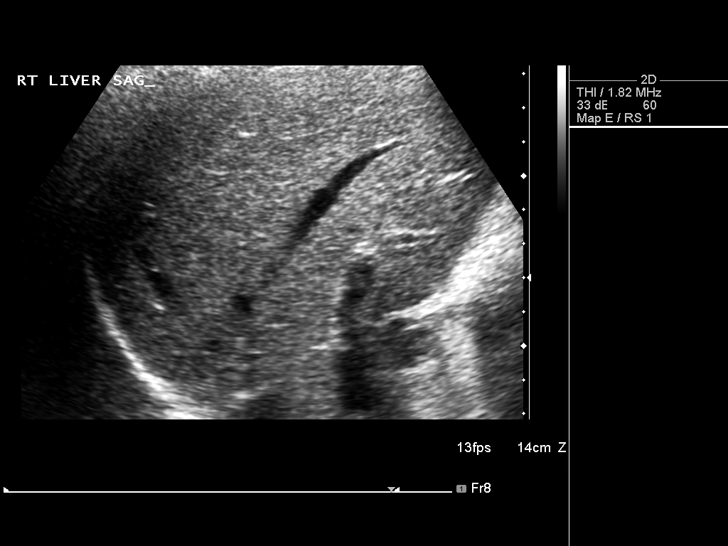
[im 31/83]
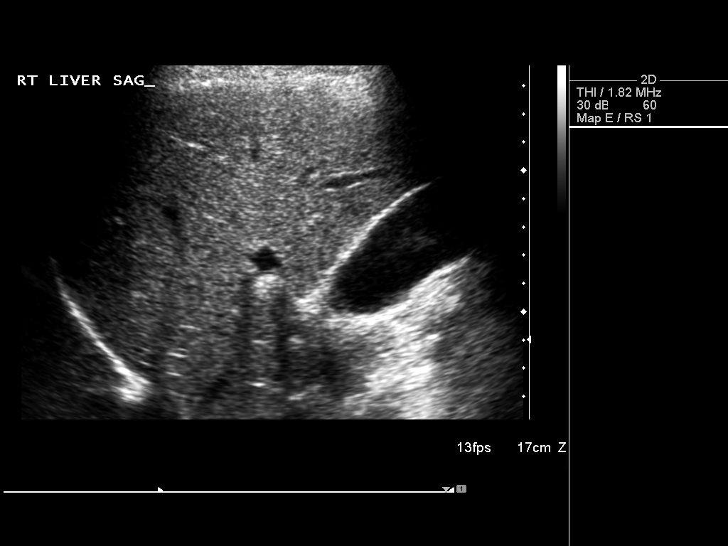
[im 38/83]
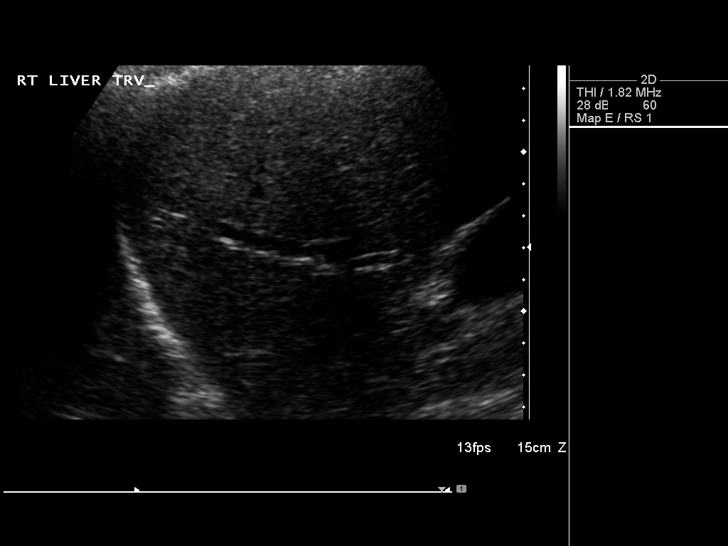
[im 45/83]
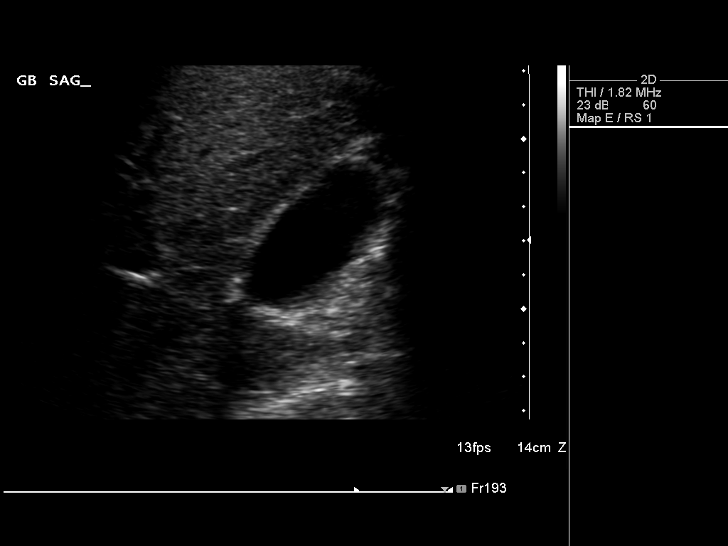
[im 52/83]
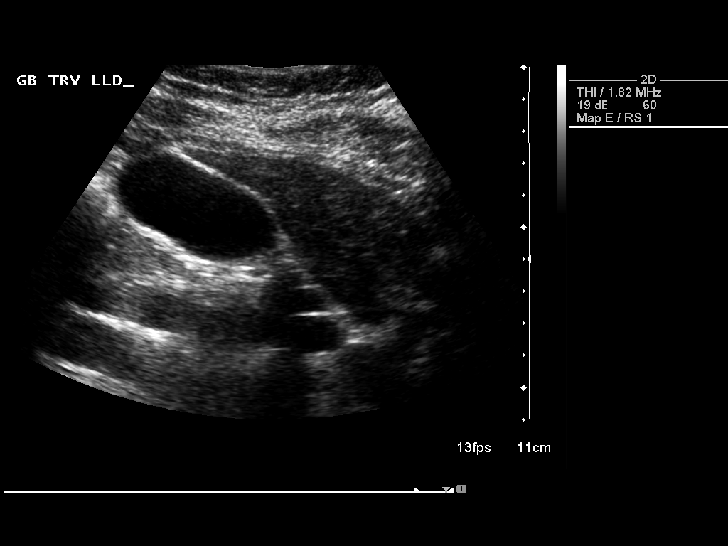
[im 55/83]
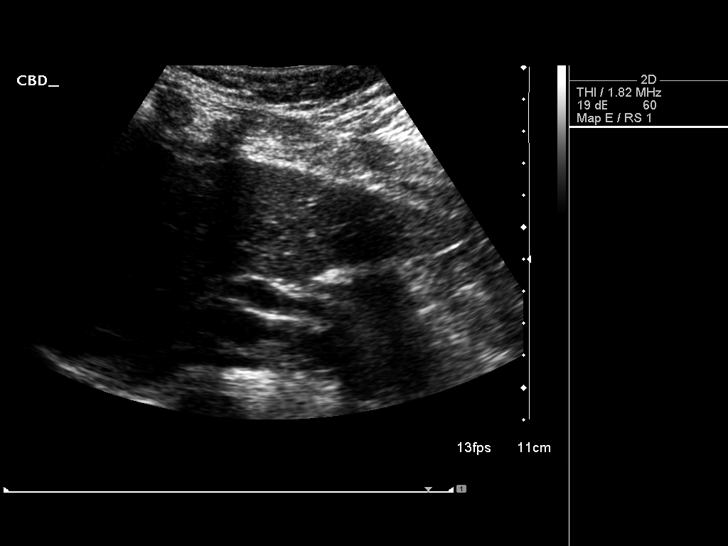
[im 62/83]
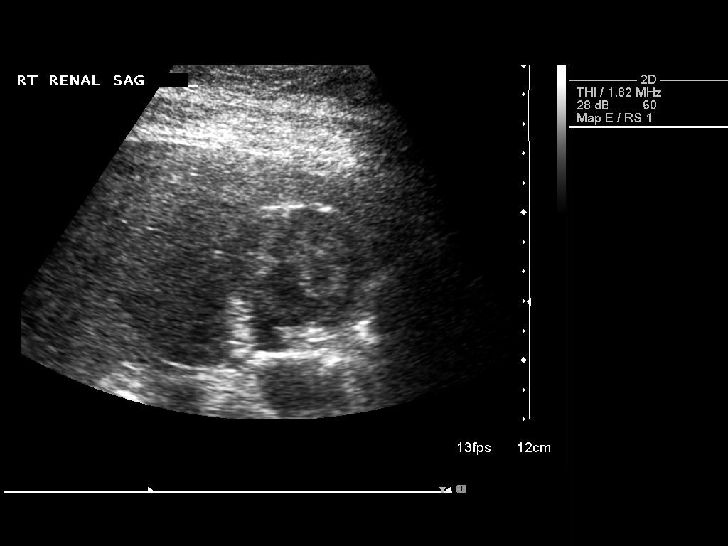
[im 69/83]
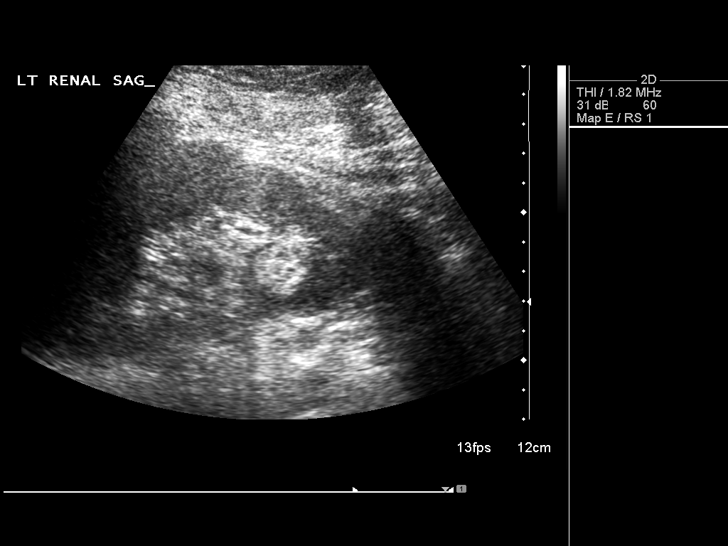
[im 76/83]
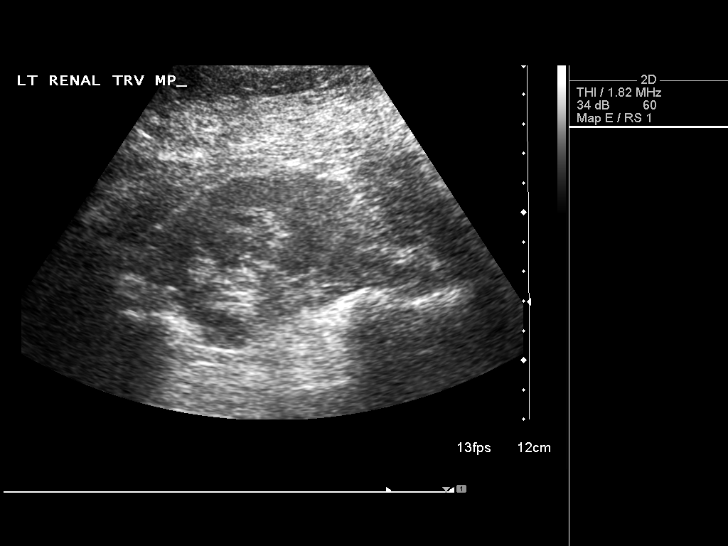
[im 83/83]
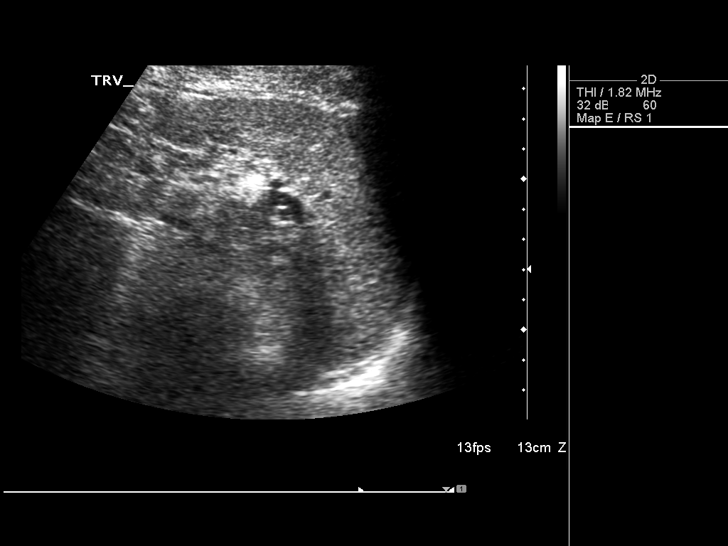

[14 of 25 positions shown; findings below may reference images not displayed]

FINDINGS: Gallbladder:  No stones or wall thickening.  Negative sonographic
Basilio.

Common bile duct:   Mildly dilated, measuring up to 9 mm, similar
to prior study.  No definite distal obstruction or mass.

Liver:  13 mm cyst.  Heterogeneous echotexture.  No biliary ductal
dilatation.

IVC:  Appears normal.

Pancreas:  No focal abnormality seen.

Spleen:  Within normal limits in size and echotexture.

Right Kidney:   Normal in size and parenchymal echogenicity.  No
evidence of mass or hydronephrosis.

Left Kidney:  Normal in size and parenchymal echogenicity.  No
evidence of mass or hydronephrosis.

Abdominal aorta:  No aneurysm identified.
IMPRESSION: No acute findings.  No change since prior study.  Stable mild
dilatation of the common bile duct.

## 2013-02-09 IMAGING — MG MM CAD SCREENING MAMMO
1 series · 4 of 4 positions shown · non-contrast
Comparison: none

REASON FOR EXAM: SCR MAMMO NO ORDER CAT 2
COMMENTS:

PROCEDURE:     MAM - MAM DGTL SCRN MAM NO ORDER W/CAD  - August 18, 2012  [DATE]
RESULT:     Breast are moderately dense and nodular. CAD evaluation is no
focal. Benign calcifications.

[R CC · right · 4 of 4 slices shown]
[im 1/4]
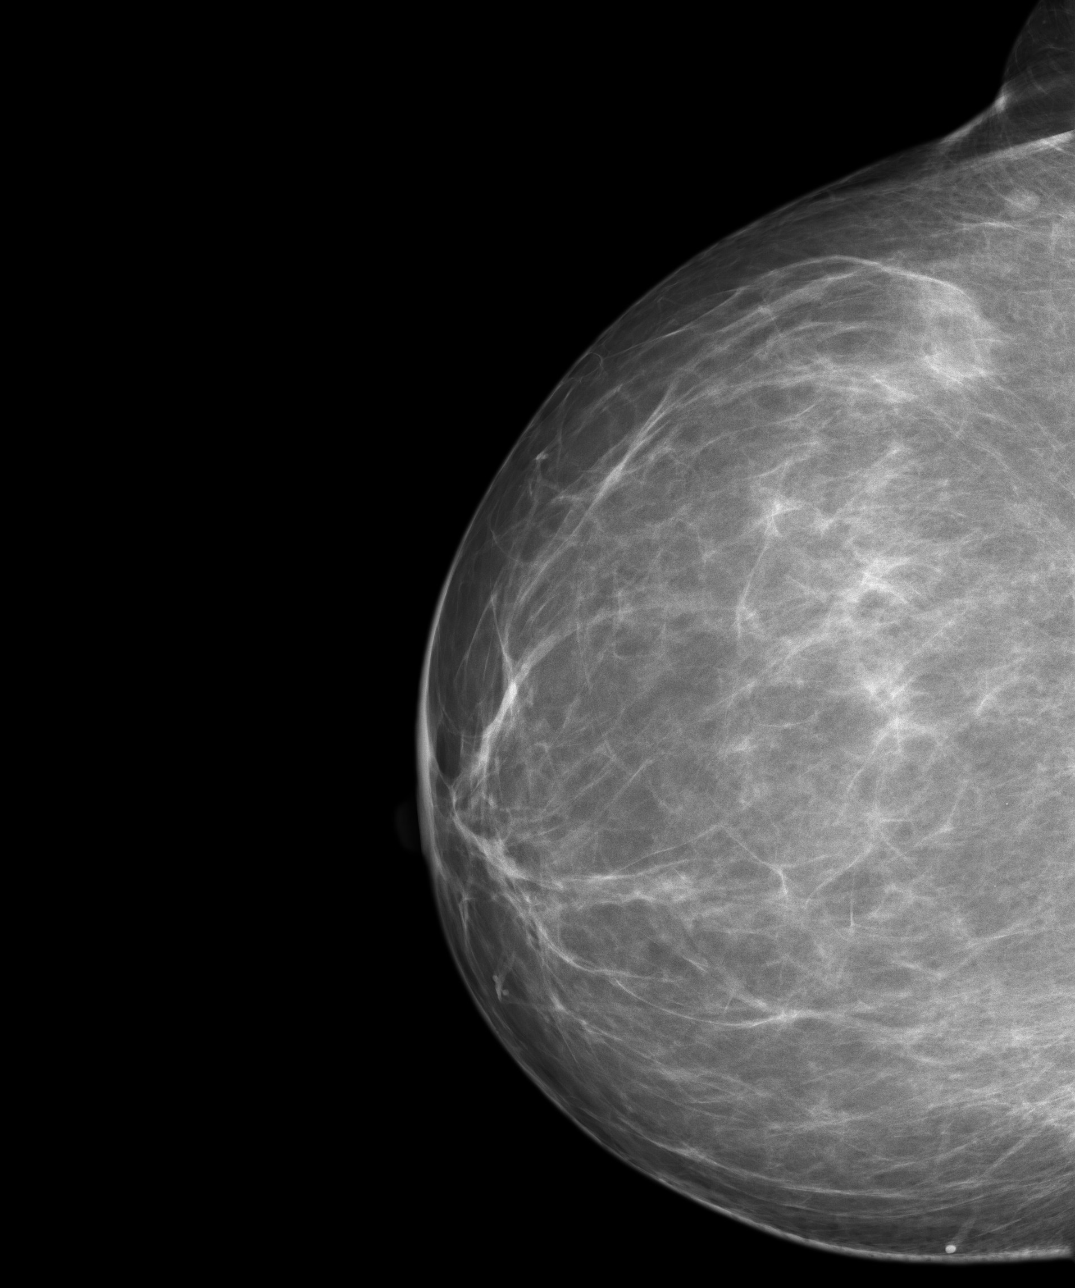
[im 2/4]
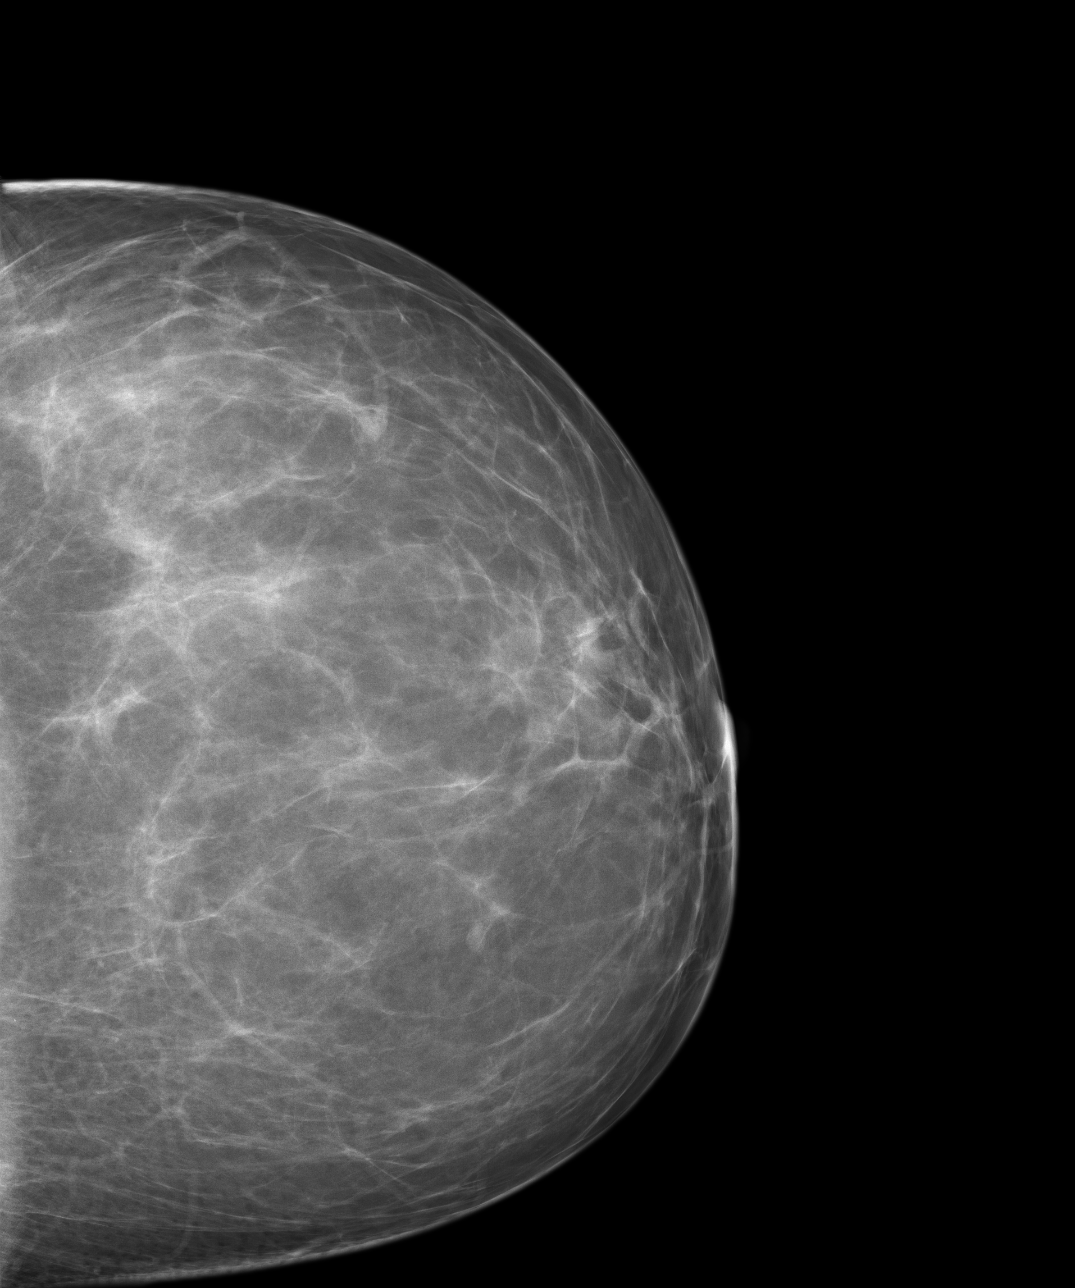
[im 3/4]
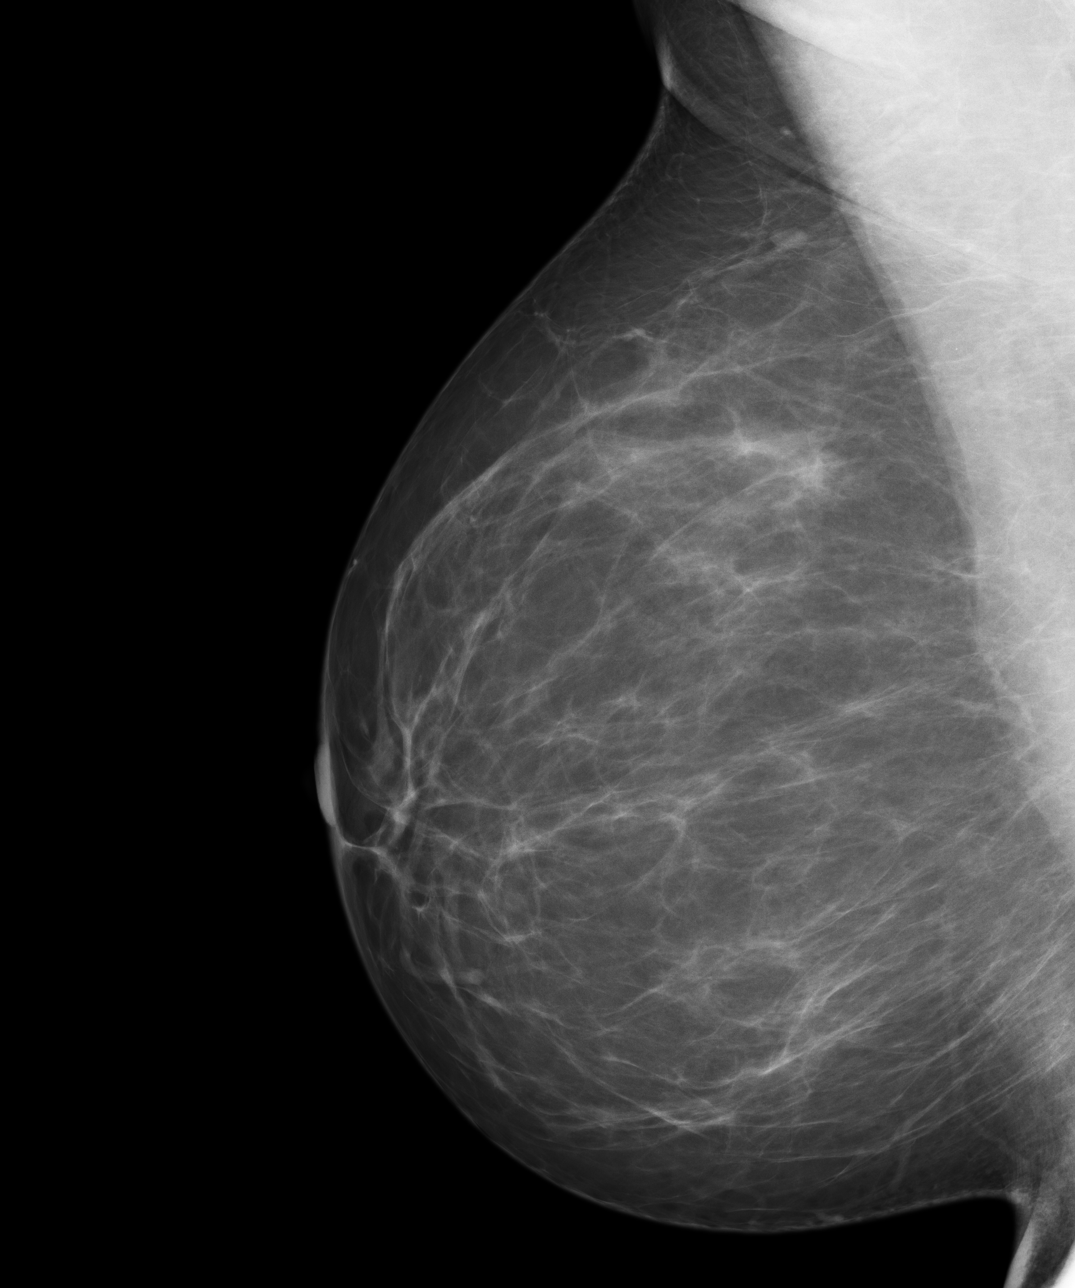
[im 4/4]
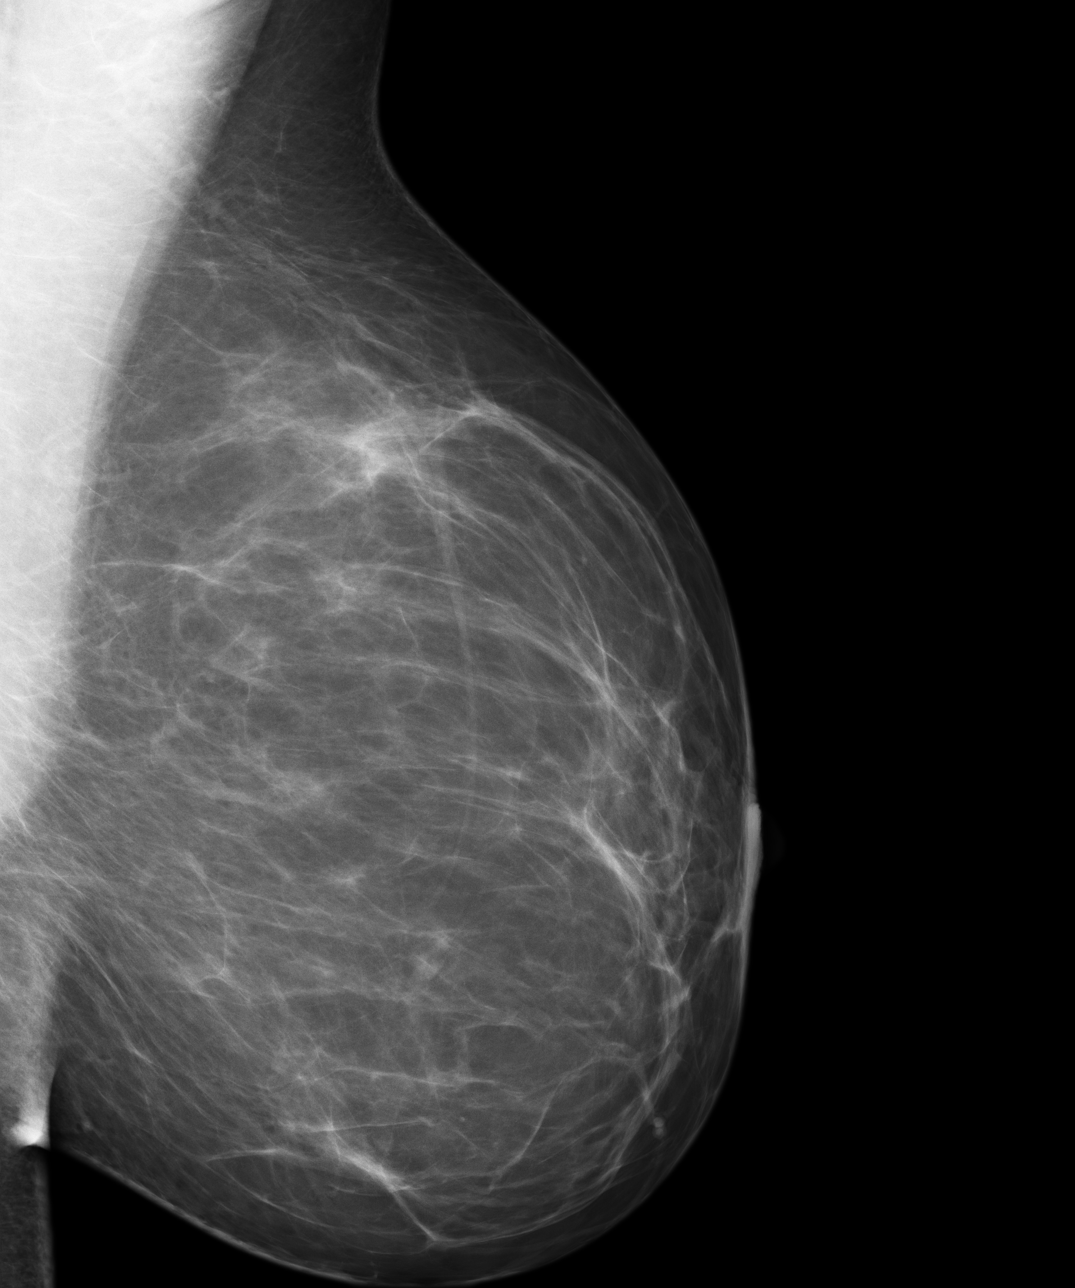

[4 of 4 positions shown; findings below may reference images not displayed]

IMPRESSION: Exam.

BI-RADS: Category 2- Benign Finding

A NEGATIVE MAMMOGRAM REPORT DOES NOT PRECLUDE BIOPSY OR OTHER EVALUATION OF
A CLINICALLY PALPABLE OR OTHERWISE SUSPICIOUS MASS OR LESION. BREAST CANCER
MAY NOT BE DETECTED IN UP TO 10% OF CASES.

## 2013-03-13 ENCOUNTER — Other Ambulatory Visit: Payer: Self-pay | Admitting: Obstetrics and Gynecology

## 2013-03-13 DIAGNOSIS — N6310 Unspecified lump in the right breast, unspecified quadrant: Secondary | ICD-10-CM

## 2013-03-17 ENCOUNTER — Ambulatory Visit: Payer: Self-pay | Admitting: Obstetrics and Gynecology

## 2013-03-27 ENCOUNTER — Other Ambulatory Visit: Payer: PRIVATE HEALTH INSURANCE

## 2013-09-08 IMAGING — US ULTRASOUND RIGHT BREAST
1 series · 14 of 25 positions shown · non-contrast
Comparison: none

REASON FOR EXAM: rt brst mass 2 oclock
COMMENTS:

PROCEDURE:     US  - US BREAST RIGHT  - March 17, 2013  [DATE]
RESULT:     COMPARISON:  08/18/2012, 07/28/2010, 08/19/2008
TECHNIQUE: Digital right diagnostic mammograms were obtained. FDA approved
computer-aided detection (CAD) for mammography was utilized for this study.
BREAST COMPOSITION: The breast composition is SCATTERED FIBROGLANDULAR
TISSUE (glandular tissue is  25-50%)

[Series 1: ultrasound right breast · 0.11mm/px · 14 of 26 slices shown]
[im 1/26]
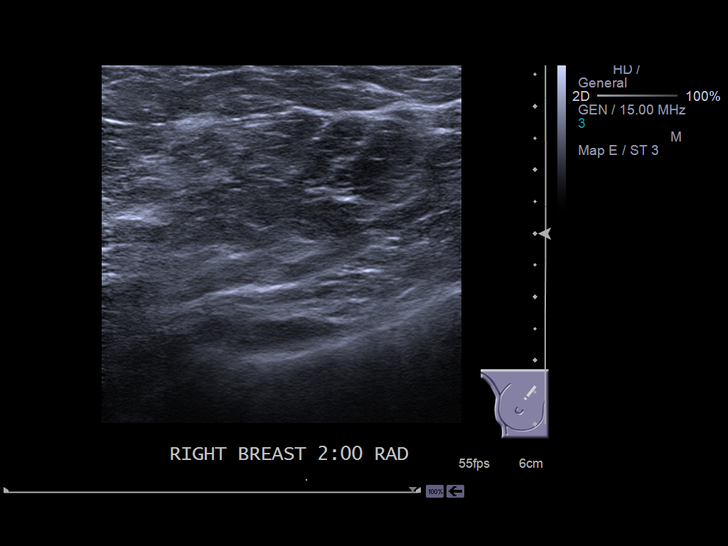
[im 3/26]
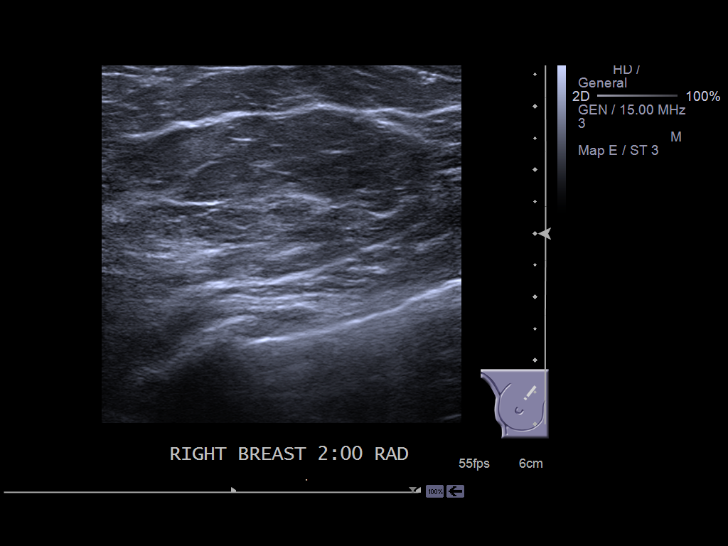
[im 5/26]
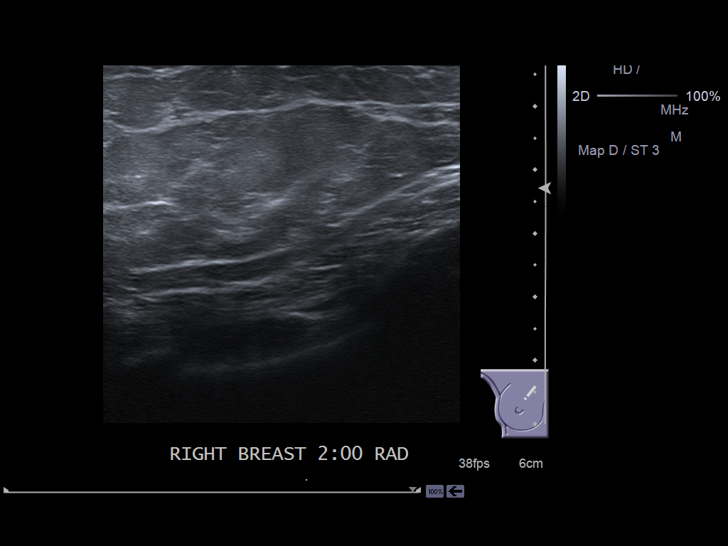
[im 7/26]
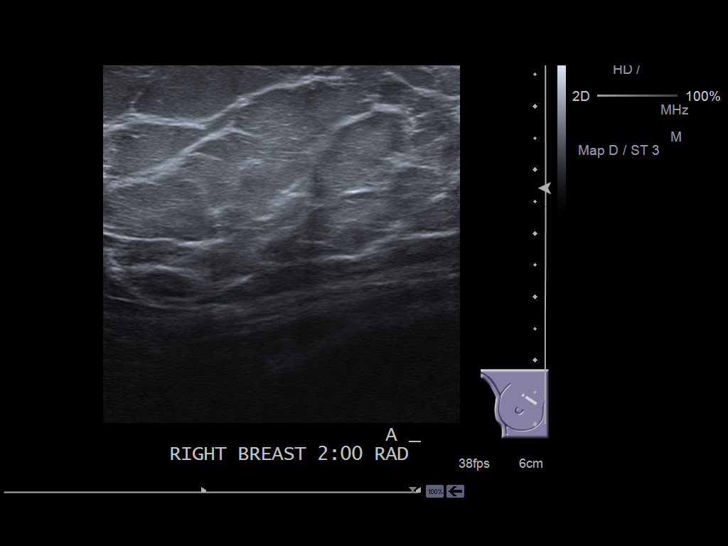
[im 9/26]
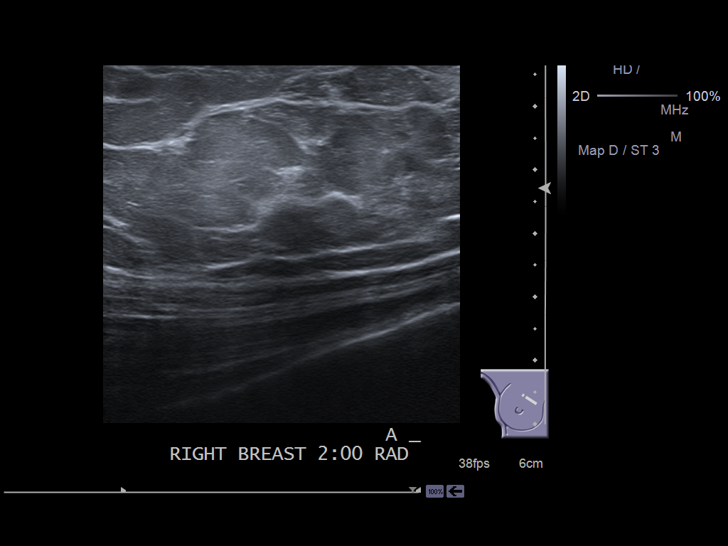
[im 10/26]
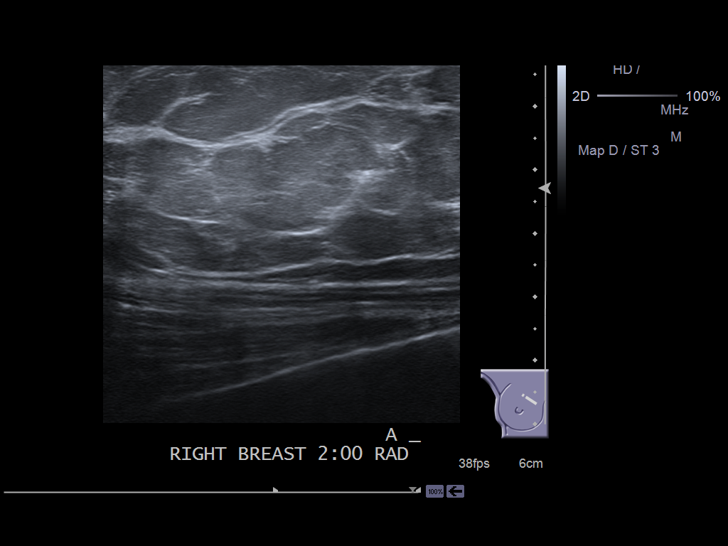
[im 12/26]
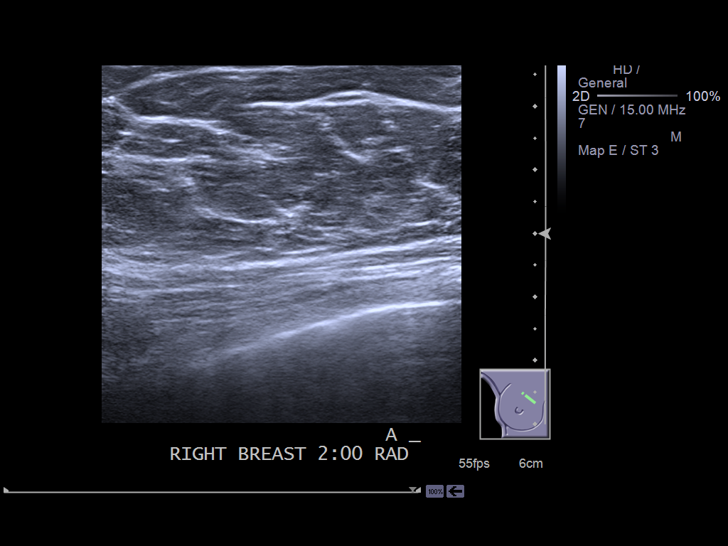
[im 14/26]
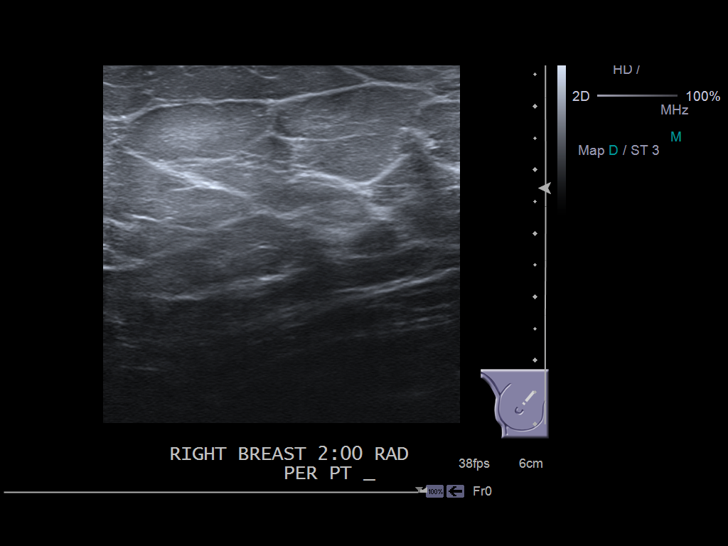
[im 16/26]
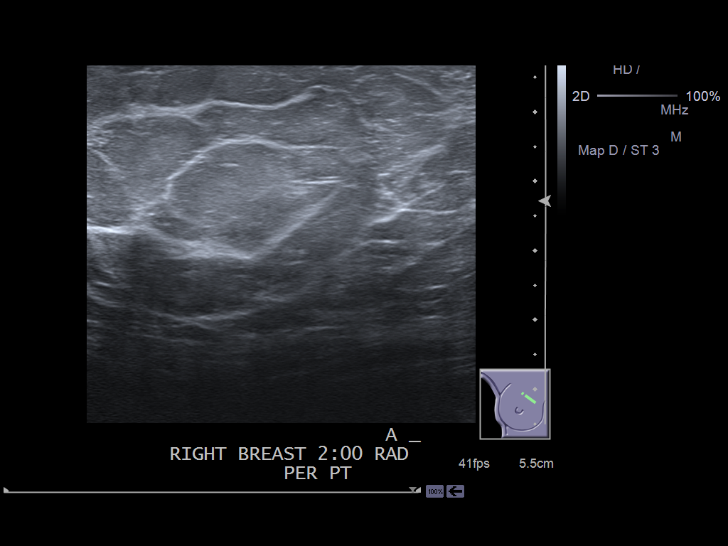
[im 17/26]
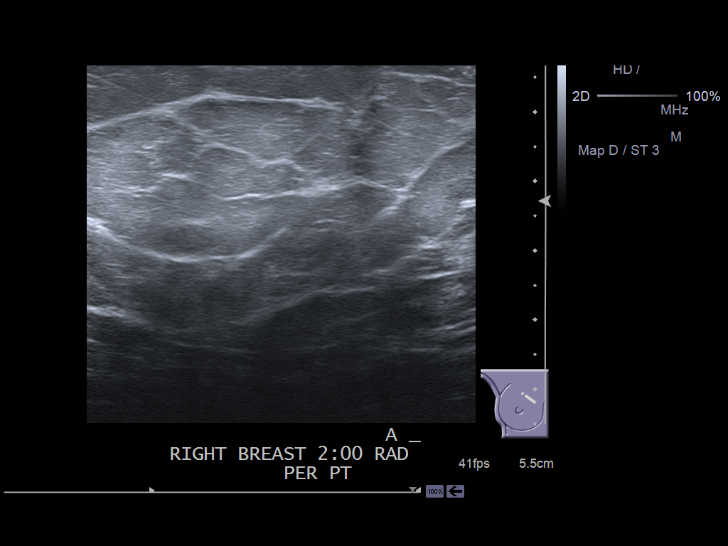
[im 19/26]
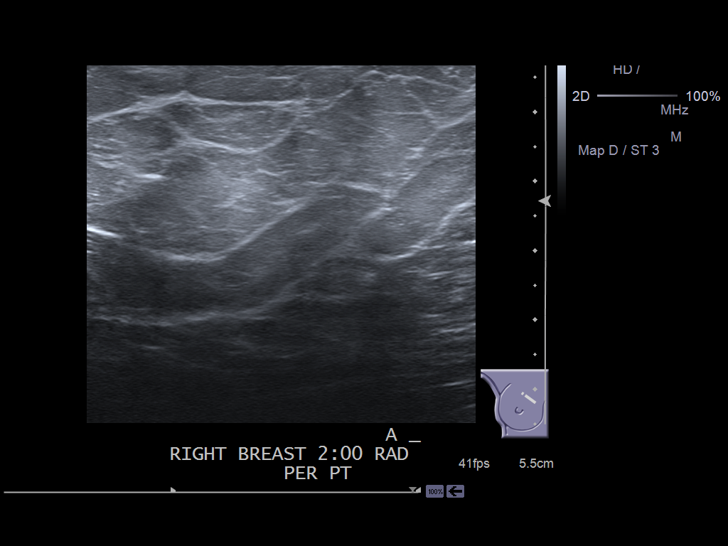
[im 21/26]
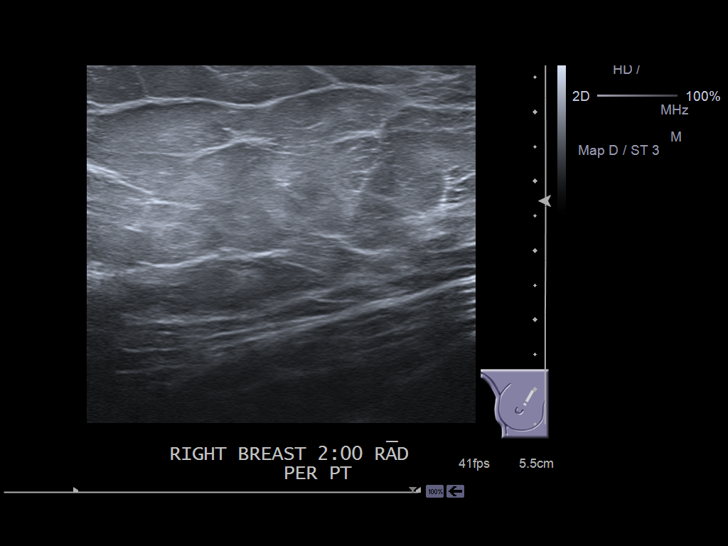
[im 23/26]
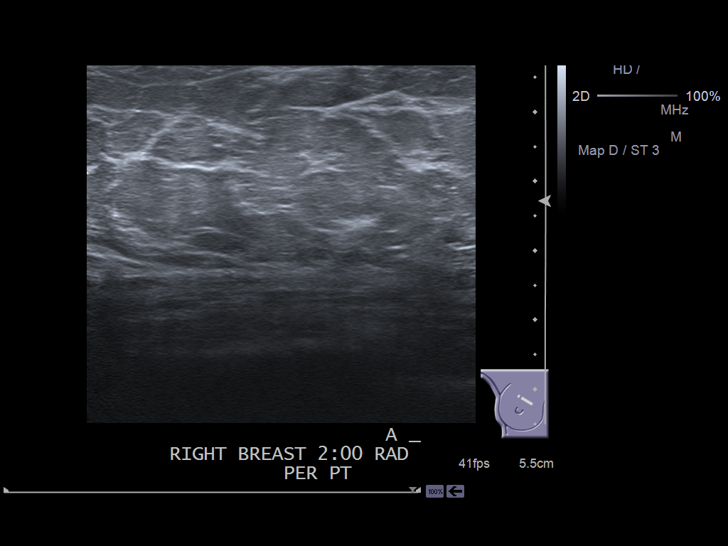
[im 26/26]
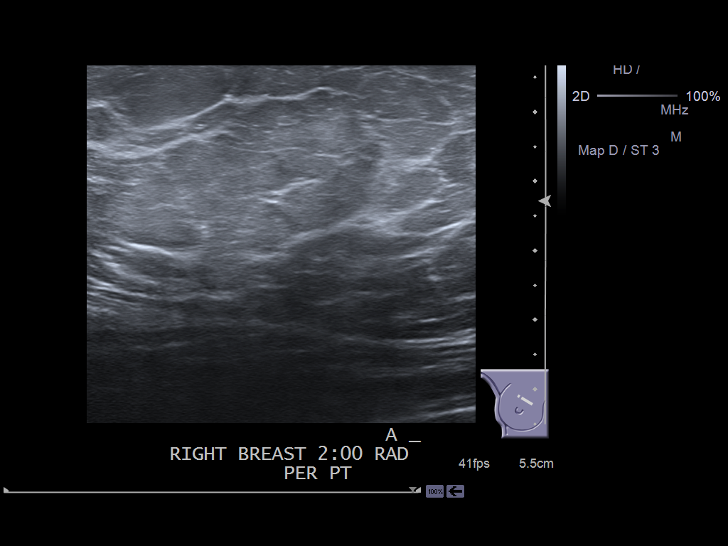

[14 of 25 positions shown; findings below may reference images not displayed]

FINDING: There is no dominant mass, architectural distortion or clusters of
suspicious microcalcifications.

Real-time sonography of the upper inner right breast was performed. There is
no solid or cystic mass.
IMPRESSION: 1.    Negative right breast diagnostic mammogram.

BI-RADS:  Category 1- Negative

A negative mammogram report does not preclude biopsy or other evaluation of
a clinically palpable or otherwise suspicious mass or lesion. Breast cancer
may not be detected by mammography in up to 10% of cases.

[REDACTED]

## 2013-11-24 ENCOUNTER — Encounter: Payer: Self-pay | Admitting: Obstetrics & Gynecology

## 2013-12-22 ENCOUNTER — Encounter: Payer: Self-pay | Admitting: Obstetrics & Gynecology

## 2013-12-22 ENCOUNTER — Ambulatory Visit (INDEPENDENT_AMBULATORY_CARE_PROVIDER_SITE_OTHER): Payer: 59 | Admitting: Obstetrics & Gynecology

## 2013-12-22 VITALS — BP 136/88 | HR 52 | Resp 18 | Ht 66.25 in | Wt 174.0 lb

## 2013-12-22 DIAGNOSIS — Z0000 Encounter for general adult medical examination without abnormal findings: Secondary | ICD-10-CM

## 2013-12-22 DIAGNOSIS — Z803 Family history of malignant neoplasm of breast: Secondary | ICD-10-CM

## 2013-12-22 DIAGNOSIS — N951 Menopausal and female climacteric states: Secondary | ICD-10-CM

## 2013-12-22 DIAGNOSIS — N83209 Unspecified ovarian cyst, unspecified side: Secondary | ICD-10-CM

## 2013-12-22 DIAGNOSIS — Z800 Family history of malignant neoplasm of digestive organs: Secondary | ICD-10-CM

## 2013-12-22 LAB — POCT URINALYSIS DIPSTICK
Bilirubin, UA: NEGATIVE
Glucose, UA: NEGATIVE
KETONES UA: NEGATIVE
Leukocytes, UA: NEGATIVE
NITRITE UA: NEGATIVE
Protein, UA: NEGATIVE
RBC UA: NEGATIVE
Urobilinogen, UA: NEGATIVE

## 2013-12-22 MED ORDER — LEVOTHYROXINE SODIUM 175 MCG PO TABS: ORAL_TABLET | ORAL | Status: DC

## 2013-12-22 NOTE — Progress Notes (Addendum)
43 y.o. [id number] MarriedCaucasianF here for new patient visit.  Patient of Dr. Orvan Seen at [facility].  Patient has concerns about ovarian cysts.  Has had recurrent issues with cysts.  Had recent pain and ultrasound.  Has decided to change practices for continuation of care.  No records with her. Was advised she needed a follow up u/s.  Will need to have her sign a release, review records, and then plan follow-up.  Last MMG at [facility] on 9/14.  Strong family hx of breast cancer.  All of her maternal grandfather's sisters had breast cancer--nine in total.  Patient has had genetic testing at [facility] at [facility] done 1/14.  Has 9 paterna great aunts with breast cancer (all of her mother's paternal aunts).  Also reports having colaris testing due to colon cancer in her brother.  Colonoscopy was negative.  Will have her sign release as well.    Husband was Mudlogger of cancer services at [employer].  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  Traveling a lot.  Patient has a new [place].  Reports when she was between jobs, she started having more issues with anxiety and OCD.  OCD symptoms are mild and manageable but she hasn't had this much issue since she was a teenager.  Wonders if hormonal change can be contributing.  We discussed menopausal symptoms.  Her mother (now deceased from [place]) had hysterectomy in early 16s but pt doesn't really know why and what was occuring.  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  She at one point counted all the things she was worrying about and realized she was worrying about no less than 14 different disease states.  She, logically, knows she can't have all of these issues all at once.  Was prescribed Lexapro.  Never started it.  Symptoms are better.  She is scared about any medication knowing her mother had ALS.    Cycles still regular.  Patient's last menstrual period was 12/15/2013.          Sexually  active: yes  The current method of family planning is vasectomy.    Exercising: yes  Running Smoker:  no  Health Maintenance: Pap:  03/2013 History of abnormal Pap:  Yes, 2012 MMG:  06/2013 - Normal Colonoscopy:  03/2012 - [facility]).  Tamala Fothergill. Called [facility].  Colonoscopy was 2012 with Dr. Tiffany Kocher.  F/U 5 years.  They couldn't fax records??) BMD:   2005 TDaP:  2007 Screening Labs: PCP, Hb today: PCP, Urine today: Clear   reports that she has never smoked. She has never used smokeless tobacco. She reports that she drinks about 1.8 ounces of alcohol per week. She reports that she does not use illicit drugs.  Past Medical History  Diagnosis Date  . ALLERGIC RHINITIS 07/26/2009  . ELEVATED BLOOD PRESSURE 08/22/2009  . GERD 08/22/2009  . HYPOTHYROIDISM 07/26/2009  . 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 Gastritis   . Ovarian cyst   . Perimenopausal     Past Surgical History  Procedure Laterality Date  . Thyroidectomy  08-2002  . Colonoscopy  June 2009/ 03-2012    per Dr. Sumner Boast at [facility] , repeat 3 yrs     Current Outpatient Prescriptions  Medication Sig Dispense Refill  . Biotin 5000 MCG CAPS Take by mouth daily.      . cetirizine (ZYRTEC) 10 MG tablet Take 10 mg by  mouth daily.        . Cholecalciferol (VITAMIN D) 2000 UNITS tablet Take 2,000 Units by mouth daily.      Marland Kitchen levothyroxine (SYNTHROID, LEVOTHROID) 175 MCG tablet Take 162.5 mcg by mouth daily. Takes 162.5 mcg      . Omega-3 Fatty Acids (FISH OIL PO) Take by mouth daily.       No current facility-administered medications for this visit.    Family History  Problem Relation Age of Onset  . Parkinsonism Maternal Grandmother   . Cancer Maternal Grandmother     Throat  . Colon cancer Maternal Grandmother   . Lung cancer Maternal Grandmother   . Arthritis    . Hypertension    . Lung cancer    . Breast cancer    . Colon cancer    .  ALS Mother   . Cancer Mother     Spinal Cord  . Hypertension Father   . Colon cancer Maternal Aunt   . Cancer Maternal Grandfather     ROS:  Pertinent items are noted in HPI.  Otherwise, a comprehensive ROS was negative.  Exam:   BP 136/88  Pulse 52  Resp 18  Ht 5' 6.25" (1.683 m)  Wt 174 lb (78.926 kg)  BMI 27.86 kg/m2  LMP 12/15/2013  Height: 5' 6.25" (168.3 cm)  Ht Readings from Last 3 Encounters:  12/22/13 5' 6.25" (1.683 m)  01/05/11 5\' 6"  (1.676 m)  07/26/09 4' 7.75" (1.416 m)    General appearance: alert, cooperative and appears stated age Neurologic: Grossly normal  A:  Establishing care with hx of recent ovarian cyst Family hx of breast and colon cancer H/O OCD symptoms which have improved  P:        Release of records for u/s, genetic testing, last pap and MMG, and colonoscopy all signed.  Will make plans for follow up when I have records.            Order for [facility] and estradiol given.  Pt will have drawn with labs next week in Dr. Wilmon Pali office.  ~30 minutes spent with patient >50% of time was in face to face discussion of above.

## 2013-12-23 DIAGNOSIS — Z800 Family history of malignant neoplasm of digestive organs: Secondary | ICD-10-CM | POA: Insufficient documentation

## 2013-12-23 DIAGNOSIS — N83209 Unspecified ovarian cyst, unspecified side: Secondary | ICD-10-CM | POA: Insufficient documentation

## 2013-12-23 DIAGNOSIS — Z803 Family history of malignant neoplasm of breast: Secondary | ICD-10-CM | POA: Insufficient documentation

## 2014-01-29 ENCOUNTER — Other Ambulatory Visit: Payer: Self-pay | Admitting: Obstetrics & Gynecology

## 2014-01-29 DIAGNOSIS — N83209 Unspecified ovarian cyst, unspecified side: Secondary | ICD-10-CM

## 2014-02-01 ENCOUNTER — Telehealth: Payer: Self-pay | Admitting: Obstetrics & Gynecology

## 2014-02-01 NOTE — Telephone Encounter (Signed)
Left message for patient to call back. Need to go over benefits quote received and schedule PUS.

## 2014-02-01 NOTE — Telephone Encounter (Signed)
Returning a call to Nichole Flores. °

## 2014-02-01 NOTE — Telephone Encounter (Signed)
Spoke with patient. Advised of $15 copay quote for PUS. Scheduled PUS

## 2014-02-04 ENCOUNTER — Ambulatory Visit (INDEPENDENT_AMBULATORY_CARE_PROVIDER_SITE_OTHER): Payer: 59 | Admitting: Obstetrics & Gynecology

## 2014-02-04 ENCOUNTER — Ambulatory Visit (INDEPENDENT_AMBULATORY_CARE_PROVIDER_SITE_OTHER): Payer: 59

## 2014-02-04 ENCOUNTER — Telehealth: Payer: Self-pay | Admitting: Obstetrics & Gynecology

## 2014-02-04 VITALS — BP 118/80 | Ht 66.25 in | Wt 176.0 lb

## 2014-02-04 DIAGNOSIS — N838 Other noninflammatory disorders of ovary, fallopian tube and broad ligament: Secondary | ICD-10-CM

## 2014-02-04 DIAGNOSIS — N83209 Unspecified ovarian cyst, unspecified side: Secondary | ICD-10-CM

## 2014-02-04 NOTE — Telephone Encounter (Signed)
Message left to return call to Chassity Ludke at [phone].    

## 2014-02-04 NOTE — Telephone Encounter (Signed)
Pt needs to schedule an aex per Dr Sabra Heck for October. There are no appts available and would like nurse to check with Dr Sabra Heck to see if she want her to wait.

## 2014-02-04 NOTE — Progress Notes (Signed)
43 y.o. Marriedfemale here for a pelvic ultrasound.    LMP:  01/19/14  Sexually active:  yes  Contraception: vasectomy  FINDINGS: UTERUS: 11.1 x 7.1 x 5.0cm EMS: 11.6, trilaminar ADNEXA:   Left ovary 4.0 x 1.5 x 1.4cm   Right ovary 3.1 x 1.6 x 1.2cm.  2.1cm avascular, smooth cyst that appears to be a paratubal cyst CUL DE SAC: no free fluid  Images reviewed with pt.  Prior cyst was noted to measure 2.0 x 1.6cm 11/24/13.  This does look like a paratubal cyst on images today.  Do not feel repeat u/s necessary unless pt starts to experience pain or other new issues.  Reports she has plans to see Dr. Rachel Moulds to be assessed for OCD.  Would like my opinion of this.  I feel evaluation is appropriate.  She can decide fi testing indicates need for medications that at that time, we could discuss further medications if she is apprehensive.  Has lots of concerns about any centrally acting medication due to mother with ALS.  Assessment:  Probable 2.1cm paratubal cyst (stable from u/s 1/15) Plan: Repeat with new symptoms.  Precautions given.  All questions answered.  ~15 minutes spent with patient >50% of time was in face to face discussion of above.

## 2014-02-05 ENCOUNTER — Encounter: Payer: Self-pay | Admitting: Obstetrics & Gynecology

## 2014-02-08 NOTE — Telephone Encounter (Signed)
Spoke with patient. AEX scheduled Oct 16th at 1400 with Dr.Miller. Patient agreeable to date and time.  Routing to provider for final review. Patient agreeable to disposition. Will close encounter

## 2014-02-08 NOTE — Telephone Encounter (Signed)
Patient returning Tracy's call. °

## 2014-02-24 ENCOUNTER — Other Ambulatory Visit: Payer: Self-pay

## 2014-02-24 DIAGNOSIS — Z1231 Encounter for screening mammogram for malignant neoplasm of breast: Secondary | ICD-10-CM

## 2014-04-06 DIAGNOSIS — K589 Irritable bowel syndrome without diarrhea: Secondary | ICD-10-CM | POA: Insufficient documentation

## 2014-05-04 ENCOUNTER — Ambulatory Visit
Admission: RE | Admit: 2014-05-04 | Discharge: 2014-05-04 | Disposition: A | Discharge status: Home / Self Care | Payer: 59 | Source: Ambulatory Visit

## 2014-05-04 DIAGNOSIS — Z1231 Encounter for screening mammogram for malignant neoplasm of breast: Secondary | ICD-10-CM

## 2014-05-26 ENCOUNTER — Ambulatory Visit: Payer: PRIVATE HEALTH INSURANCE

## 2014-06-07 ENCOUNTER — Telehealth: Payer: Self-pay | Admitting: Obstetrics & Gynecology

## 2014-06-07 NOTE — Telephone Encounter (Signed)
Received labs, will place in Dr. Ammie Ferrier in basket folders.   Garfield, Sanda Klein,  NP ordered 150 mg progesterone oral pills take days 12-28. Patient has been taking progesterone but has decided that she would like Dr. Sabra Heck to handle her hormones and would like Dr. Sabra Heck to review and advise.  Started last cycle 05/01/14, labs were drawn on 05/14/14.

## 2014-06-07 NOTE — Telephone Encounter (Addendum)
Patient said she had some labs done somewhere else and was told her progesterone was low and the told her she needed to take some hormone replacement. She wanted to get dr Sanjuan Dame opinion. Pt will fax the results from other office her cycle started 05/01/14 labs taken 05/14/14

## 2014-06-11 NOTE — Telephone Encounter (Signed)
Patient returned call. She is wondering if she should take pills this month. I advised that I would call her back as soon as I had a message from Dr. Sabra Heck regarding her results.

## 2014-06-11 NOTE — Telephone Encounter (Signed)
Returned call to patient and she was given message from Dr. Sabra Heck.  She has not started progesterone.  She states at this time, she will wait to take it as she feels she may not need it and was wondering Dr. Ammie Ferrier opinion only. She will call back with any concerns.  Routing to provider for final review. Patient agreeable to disposition. Will close encounter

## 2014-06-11 NOTE — Telephone Encounter (Signed)
Did she feel any different on the progesterone?  Did it do anything to change bleeding?  I don't have a problem with it but I typically don't check or follow progesterone levels, except when doing fertility treatment.  Does that help?  I typically do not do extensive hormonal testing like she had done as it is very expensive for most patients, just FYI for her to know.

## 2014-08-13 ENCOUNTER — Ambulatory Visit (INDEPENDENT_AMBULATORY_CARE_PROVIDER_SITE_OTHER): Payer: 59 | Admitting: Obstetrics & Gynecology

## 2014-08-13 ENCOUNTER — Encounter: Payer: Self-pay | Admitting: Obstetrics & Gynecology

## 2014-08-13 VITALS — BP 116/70 | HR 72 | Ht 66.0 in | Wt 174.0 lb

## 2014-08-13 DIAGNOSIS — Z01419 Encounter for gynecological examination (general) (routine) without abnormal findings: Secondary | ICD-10-CM

## 2014-08-13 DIAGNOSIS — Z124 Encounter for screening for malignant neoplasm of cervix: Secondary | ICD-10-CM

## 2014-08-13 DIAGNOSIS — Z0000 Encounter for general adult medical examination without abnormal findings: Secondary | ICD-10-CM

## 2014-08-13 LAB — POCT URINALYSIS DIPSTICK
BILIRUBIN UA: NEGATIVE
Blood, UA: NEGATIVE
Glucose, UA: NEGATIVE
KETONES UA: NEGATIVE
LEUKOCYTES UA: NEGATIVE
Nitrite, UA: NEGATIVE
PH UA: 6
PROTEIN UA: NEGATIVE
Urobilinogen, UA: NEGATIVE

## 2014-08-13 NOTE — Progress Notes (Signed)
43 y.o. [id number] MarriedCaucasianF here for annual exam.  Doing well.  Work is good.  Husband took job in [place], [place].  They are probably going to move in the summer.  This is very stressful for pt.  She reports increased anxiety this year.  On Viibrrd.  Saw Dr. Minna Antis and he really felt she needed this.  Now on Cytomel.  Feeling better on it.  She will have repeat labs in 4 weeks.    Seeing Nolon Nations with [facility].  Did lots of labs with her.  Was told she was borderline metabolic syndrome.  I disagree with her about this diagnosis.  She is being managed there for her thyroid disease.  TSH 0.01.    Patient's last menstrual period was 08/02/2014.          Sexually active: Yes The current method of family planning is vasectomy.    Exercising: Yes.    Home exercise routine includes running 3 times a week. Smoker:  no  Health Maintenance: Pap:  03/2013 History of abnormal Pap:  Yes, 2012 MMG:  04/2014 Bi-Rads Neg Colonoscopy:  03/2012-[facility] Colonoscopy was 2012 with Dr. Tiffany Kocher. F/U 5 years BMD:   2005 TDaP:  2007 Screening Labs: PCP, Hb today: PCP, Urine today: neg, ph: 6.0   reports that she has never smoked. She has never used smokeless tobacco. She reports that she drinks about 1.8 ounces of alcohol per week. She reports that she does not use illicit drugs.  Past Medical History  Diagnosis Date  . ALLERGIC RHINITIS 07/26/2009  . ELEVATED BLOOD PRESSURE 08/22/2009  . GERD 08/22/2009  . HYPOTHYROIDISM 07/26/2009  . 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 Gastritis   . Ovarian cyst   . Perimenopausal     Past Surgical History  Procedure Laterality Date  . Thyroidectomy  08-2002    [place]  . Colonoscopy  June 2009/ 03-2012    per Dr. Sumner Boast at [facility] , repeat 3 yrs     Current Outpatient Prescriptions  Medication Sig Dispense Refill  . Biotin 5000 MCG CAPS Take by mouth daily.      . cetirizine  (ZYRTEC) 10 MG tablet Take 10 mg by mouth daily.        . Cholecalciferol (VITAMIN D) 2000 UNITS tablet Take 2,000 Units by mouth daily.      . Levothyroxine Sodium (TIROSINT) 125 MCG CAPS Take by mouth daily before breakfast.      . liothyronine (CYTOMEL) 5 MCG tablet Take 35 mcg by mouth daily. 20 mcg in the am and 15 in the pm      . Magnesium 250 MG TABS Take 250 mg by mouth daily.      . Omega-3 Fatty Acids (FISH OIL PO) Take by mouth daily.      Marland Kitchen VITAMIN K PO Take by mouth daily.       No current facility-administered medications for this visit.    Family History  Problem Relation Age of Onset  . Parkinson's disease Maternal Grandmother   . Throat cancer Maternal Grandmother     smoker  . Colon cancer Other 36    maternal uncle  . Lung cancer Maternal Grandfather     smoker  . Breast cancer Other     9--maternal great aunts (maternal grandfather's sisters)  . ALS Mother 11    died age 85  . Cancer Mother     Spinal Cord  .  Hypertension Father     ROS:  Pertinent items are noted in HPI.  Otherwise, a comprehensive ROS was negative.  Exam:   BP 116/70  Pulse 72  Ht 5\' 6"  (1.676 m)  Wt 174 lb (78.926 kg)  BMI 28.10 kg/m2  LMP 08/02/2014   Height: 5\' 6"  (167.6 cm)  Ht Readings from Last 3 Encounters:  08/13/14 5\' 6"  (1.676 m)  02/04/14 5' 6.25" (1.683 m)  12/22/13 5' 6.25" (1.683 m)    General appearance: alert, cooperative and appears stated age Head: Normocephalic, without obvious abnormality, atraumatic Neck: no adenopathy, supple, symmetrical, trachea midline and thyroid normal to inspection and palpation Lungs: clear to auscultation bilaterally Breasts: normal appearance, no masses or tenderness Heart: regular rate and rhythm Abdomen: soft, non-tender; bowel sounds normal; no masses,  no organomegaly Extremities: extremities normal, atraumatic, no cyanosis or edema Skin: Skin color, texture, turgor normal. No rashes or lesions Lymph nodes: Cervical,  supraclavicular, and axillary nodes normal. No abnormal inguinal nodes palpated Neurologic: Grossly normal   Pelvic: External genitalia:  no lesions              Urethra:  normal appearing urethra with no masses, tenderness or lesions              Bartholins and Skenes: normal                 Vagina: normal appearing vagina with normal color and discharge, no lesions              Cervix: no lesions              Pap taken: Yes.   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

## 2014-08-17 LAB — IPS PAP TEST WITH HPV

## 2014-08-30 ENCOUNTER — Encounter: Payer: Self-pay | Admitting: Obstetrics & Gynecology

## 2014-10-01 DIAGNOSIS — Z85850 Personal history of malignant neoplasm of thyroid: Secondary | ICD-10-CM | POA: Insufficient documentation

## 2014-11-22 ENCOUNTER — Encounter: Payer: Self-pay | Admitting: Obstetrics & Gynecology

## 2015-01-12 ENCOUNTER — Other Ambulatory Visit: Payer: Self-pay | Admitting: Internal Medicine

## 2015-01-12 DIAGNOSIS — E038 Other specified hypothyroidism: Secondary | ICD-10-CM

## 2015-01-12 DIAGNOSIS — R0989 Other specified symptoms and signs involving the circulatory and respiratory systems: Secondary | ICD-10-CM

## 2015-01-13 ENCOUNTER — Other Ambulatory Visit (HOSPITAL_COMMUNITY): Payer: Self-pay | Admitting: Internal Medicine

## 2015-01-13 DIAGNOSIS — E038 Other specified hypothyroidism: Secondary | ICD-10-CM

## 2015-01-13 DIAGNOSIS — R0989 Other specified symptoms and signs involving the circulatory and respiratory systems: Secondary | ICD-10-CM

## 2015-01-14 ENCOUNTER — Ambulatory Visit (HOSPITAL_COMMUNITY)
Admission: RE | Admit: 2015-01-14 | Discharge: 2015-01-14 | Disposition: A | Discharge status: Home / Self Care | Payer: 59 | Source: Ambulatory Visit | Attending: Internal Medicine | Admitting: Internal Medicine

## 2015-01-14 DIAGNOSIS — I6523 Occlusion and stenosis of bilateral carotid arteries: Secondary | ICD-10-CM | POA: Diagnosis not present

## 2015-01-14 DIAGNOSIS — R0989 Other specified symptoms and signs involving the circulatory and respiratory systems: Secondary | ICD-10-CM | POA: Diagnosis not present

## 2015-01-14 DIAGNOSIS — R599 Enlarged lymph nodes, unspecified: Secondary | ICD-10-CM | POA: Diagnosis not present

## 2015-01-14 DIAGNOSIS — E038 Other specified hypothyroidism: Secondary | ICD-10-CM

## 2015-01-14 NOTE — Progress Notes (Signed)
*  PRELIMINARY RESULTS* Vascular Ultrasound Carotid Duplex (Doppler) has been completed.  Preliminary findings: Bilateral: 1-39% ICA stenosis.  No carotid mass was sonographically visualized in right neck.    Landry Mellow, RDMS, RVT  01/14/2015, 12:08 PM

## 2015-01-31 ENCOUNTER — Other Ambulatory Visit: Payer: Self-pay

## 2015-03-17 ENCOUNTER — Ambulatory Visit (INDEPENDENT_AMBULATORY_CARE_PROVIDER_SITE_OTHER): Payer: 59 | Admitting: Obstetrics & Gynecology

## 2015-03-17 ENCOUNTER — Telehealth: Payer: Self-pay | Admitting: Obstetrics & Gynecology

## 2015-03-17 ENCOUNTER — Encounter: Payer: Self-pay | Admitting: Obstetrics & Gynecology

## 2015-03-17 VITALS — BP 148/78 | HR 64 | Resp 16 | Wt 168.0 lb

## 2015-03-17 DIAGNOSIS — N63 Unspecified lump in breast: Secondary | ICD-10-CM

## 2015-03-17 DIAGNOSIS — N6310 Unspecified lump in the right breast, unspecified quadrant: Secondary | ICD-10-CM

## 2015-03-17 NOTE — Progress Notes (Signed)
Scheduled patient while in office for a right breast dx and ultrasound at [place] on 5/23 at 8:10am. Patient is agreeable to date and time. Placed in imaging hold.

## 2015-03-17 NOTE — Telephone Encounter (Signed)
Patient calling to report a breast lump.

## 2015-03-17 NOTE — Progress Notes (Signed)
Subjective:     Patient ID: Nichole Flores, female   DOB: [birth year], 44 y.o.   MRN: [id number]  HPI 44 yo G2P2 MWF her for breast check.  She felt a lump about a week ago while in the shower.  Wasn't initially tender until she started feeling the area.  No nipple discharge.  No recent nipple discharge.  No trauma.  LMP:  02/28/15  Review of Systems  All other systems reviewed and are negative.      Objective:   Physical Exam  Constitutional: She is oriented to person, place, and time. She appears well-developed and well-nourished.  Pulmonary/Chest:    Neurological: She is alert and oriented to person, place, and time.  Skin: Skin is warm and dry.  Psychiatric: She has a normal mood and affect.       Assessment:     Right bresat mass, possible lymph node     Plan:     Diagnostic R MMG will be scheduled.  Follow up will be planned pending results.

## 2015-03-17 NOTE — Telephone Encounter (Signed)
Spoke with patient. She states she has felt a lump on R axillary area x 1 week. States she has been palpating area and now it has become more tender. No fevers or streaking of the skin.  Scheduled office visit today with Dr. Sabra Heck at 1030. Patient agreeable.  Routing to provider for final review. Patient agreeable to disposition. Will close encounter.

## 2015-03-21 ENCOUNTER — Ambulatory Visit
Admission: RE | Admit: 2015-03-21 | Discharge: 2015-03-21 | Disposition: A | Discharge status: Home / Self Care | Payer: 59 | Source: Ambulatory Visit | Attending: Obstetrics & Gynecology | Admitting: Obstetrics & Gynecology

## 2015-03-21 DIAGNOSIS — N6310 Unspecified lump in the right breast, unspecified quadrant: Secondary | ICD-10-CM

## 2015-03-23 ENCOUNTER — Telehealth: Payer: Self-pay | Admitting: Emergency Medicine

## 2015-03-23 NOTE — Telephone Encounter (Signed)
Message left to return call to Saryna Kneeland at [phone].    

## 2015-03-23 NOTE — Telephone Encounter (Signed)
-----   Message from [name], MD sent at 03/21/2015 10:29 AM EDT ----- Pt should be in MMG hold.  OK to remove.  Please call pt and let her know I do not feel she needs a repeat breast exam unless she is worried.  This is just a normal appearing lymph node.

## 2015-03-31 ENCOUNTER — Other Ambulatory Visit: Payer: Self-pay

## 2015-03-31 DIAGNOSIS — Z1231 Encounter for screening mammogram for malignant neoplasm of breast: Secondary | ICD-10-CM

## 2015-04-12 NOTE — Telephone Encounter (Signed)
Message left to return call to Neli Fofana at [phone].    

## 2015-04-13 NOTE — Telephone Encounter (Signed)
Spoke with patient and message from Dr. Sabra Heck given. Denies complaints and agreeable to Dr. Ammie Ferrier message.  Patient states she is moving to [place] in next 6 months and requests to know our records of her last colonoscopy. Advised of note from 2012 per dr. Sabra Heck at [facility]. Routing to provider for final review. Patient agreeable to disposition. Will close encounter.

## 2015-04-15 ENCOUNTER — Encounter: Payer: Self-pay | Admitting: Obstetrics & Gynecology

## 2015-04-15 ENCOUNTER — Telehealth: Payer: Self-pay | Admitting: Emergency Medicine

## 2015-04-15 DIAGNOSIS — N83209 Unspecified ovarian cyst, unspecified side: Secondary | ICD-10-CM

## 2015-04-15 NOTE — Telephone Encounter (Addendum)
Spoke with patient. Reviewed mychart message. Patient expects her cycle approximately the 27th or 28th of June. She states she has been having intermittent lower pelvic pain for one week. She states pain , when it comes, is "achy"  but tolerable and not requiring her to use any OTC treatment.  No dysuria, no back pain, no flank pain. Denies fevers or vaginal discharge. Sexually active, Vasectomy for birth control.   Advised patient will send message to Dr. Sabra Heck to review and advise. If needs to schedule office visit or plan for Pelvic ultrasound. Patient agreeable.   Patient advised if pain increases or develops severe pain to return call to office or seek care locally at emergency department for evaluation. Patient verbalizes understanding.

## 2015-04-15 NOTE — Telephone Encounter (Signed)
Chief Complaint  Patient presents with  . Advice Only    Patient sent mychart message requires clinical triage      ----- Message -----    From: Mayden,Mary-Ann A    Sent: 04/15/2015  1:21 PM EDT      To: Lyman Speller, MD Subject: Non-Urgent Medical Question  Hi Dr Sabra Heck- Checking with you about cramping and bloating I've been having half way through my cycle for about a week now. I have had ovarian cysts come and go and am wondering if another one could be causing this? I typically don't have much- if any- cramping due to my cycle so this is unusual. The Last ultrasound looking at my ovaries for a cyst was 14 months ago. Do I watch and wait? Or is this not a concern? Do these cysts come and go and cause symptoms? Thank you! Nichole Flores

## 2015-04-15 NOTE — Telephone Encounter (Signed)
Ok to schedule PUS when convenient.

## 2015-04-15 NOTE — Telephone Encounter (Signed)
Contacted patient. She would like Tuesday ultrasound appointment. 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  Scheduled Pelvic ultrasound and advised will be contacted with insurance coverage.  Patient agreeable.   Routing to [employer] for insurance pre-cert for Tuesday Ultrasound.

## 2015-04-15 NOTE — Telephone Encounter (Signed)
Telephone call for triage created to discuss message with patient and disposition as appropriate.   

## 2015-04-19 ENCOUNTER — Other Ambulatory Visit (INDEPENDENT_AMBULATORY_CARE_PROVIDER_SITE_OTHER): Payer: 59 | Admitting: Obstetrics & Gynecology

## 2015-04-19 ENCOUNTER — Ambulatory Visit: Payer: 59

## 2015-04-19 ENCOUNTER — Telehealth: Payer: Self-pay | Admitting: Obstetrics & Gynecology

## 2015-04-19 NOTE — Telephone Encounter (Signed)
Patient will be going out of town from 6/28-7/18.Rescheduled PUS appointment for 05/19/2015 at 12:30pm and 1pm consult with Dr.Miller. Patient is agreeable to date and time. Patient originally scheduled for left lower quadrant "Aching." Advised patient if symptoms worsen will need to be seen earlier for evaluation. If she is out of town and symptoms worsen will need to be seen at local ER. Patient is agreeable. "I already feel better than I did when I made the appointment."  Routing to provider for final review. Patient agreeable to disposition. Will close encounter.   Patient aware provider will review message and nurse will return call if any additional advice or change of disposition.

## 2015-04-19 NOTE — Telephone Encounter (Signed)
patient had to cancel because her son is sick. Please reschedule ultrasound

## 2015-04-20 ENCOUNTER — Other Ambulatory Visit: Payer: Self-pay | Admitting: Obstetrics & Gynecology

## 2015-04-20 DIAGNOSIS — R102 Pelvic and perineal pain: Secondary | ICD-10-CM

## 2015-05-17 ENCOUNTER — Ambulatory Visit
Admission: RE | Admit: 2015-05-17 | Discharge: 2015-05-17 | Disposition: A | Discharge status: Home / Self Care | Payer: 59 | Source: Ambulatory Visit

## 2015-05-17 ENCOUNTER — Other Ambulatory Visit: Payer: Self-pay

## 2015-05-17 DIAGNOSIS — Z1231 Encounter for screening mammogram for malignant neoplasm of breast: Secondary | ICD-10-CM

## 2015-05-19 ENCOUNTER — Other Ambulatory Visit: Payer: 59 | Admitting: Obstetrics & Gynecology

## 2015-05-19 ENCOUNTER — Other Ambulatory Visit: Payer: 59

## 2015-05-19 ENCOUNTER — Other Ambulatory Visit
Admission: RE | Admit: 2015-05-19 | Discharge: 2015-05-19 | Disposition: A | Discharge status: Home / Self Care | Payer: 59 | Source: Other Acute Inpatient Hospital | Attending: Nurse Practitioner | Admitting: Nurse Practitioner

## 2015-05-19 DIAGNOSIS — R197 Diarrhea, unspecified: Secondary | ICD-10-CM | POA: Diagnosis present

## 2015-05-19 DIAGNOSIS — F419 Anxiety disorder, unspecified: Secondary | ICD-10-CM | POA: Insufficient documentation

## 2015-05-19 LAB — C DIFFICILE QUICK SCREEN W PCR REFLEX
C Diff antigen: NEGATIVE
C Diff interpretation: NEGATIVE
C Diff toxin: NEGATIVE

## 2015-05-22 LAB — STOOL CULTURE

## 2015-05-23 ENCOUNTER — Encounter: Payer: Self-pay | Admitting: Obstetrics & Gynecology

## 2015-05-24 LAB — O&P RESULT

## 2015-05-24 LAB — GIARDIA, EIA; OVA/PARASITE: Giardia Ag, Stl: NEGATIVE

## 2015-06-02 ENCOUNTER — Ambulatory Visit (INDEPENDENT_AMBULATORY_CARE_PROVIDER_SITE_OTHER): Payer: 59

## 2015-06-02 ENCOUNTER — Ambulatory Visit (INDEPENDENT_AMBULATORY_CARE_PROVIDER_SITE_OTHER): Payer: 59 | Admitting: Obstetrics & Gynecology

## 2015-06-02 ENCOUNTER — Encounter: Payer: Self-pay | Admitting: Obstetrics & Gynecology

## 2015-06-02 DIAGNOSIS — N83201 Unspecified ovarian cyst, right side: Secondary | ICD-10-CM

## 2015-06-02 DIAGNOSIS — N8320 Unspecified ovarian cysts: Secondary | ICD-10-CM | POA: Diagnosis not present

## 2015-06-02 DIAGNOSIS — R102 Pelvic and perineal pain: Secondary | ICD-10-CM

## 2015-06-02 DIAGNOSIS — N644 Mastodynia: Secondary | ICD-10-CM | POA: Diagnosis not present

## 2015-06-02 NOTE — Progress Notes (Signed)
44 y.o. Marriedfemale here for a pelvic ultrasound due to right lower quadrant pain.  Pt moving to [place] and is very anxious about being that far away from all of her doctors that she has established here.    Cycles are still regular.   Pt has questions about right breast pain.  Had diagnostic imaging on right breast 03/21/15.  Then had screening mammogram 05/17/15.  The day after the mammogram (whcih was done only on the left since she'd had the diagnostic on the right in May) she started having right breast pain.  She doesn't feel anything and had gotten herself very worked up about it (her words).    Sexually active:  yes  Contraception: vasectomy  FINDINGS: UTERUS: 10.26 x 6.2 x 4.7cm  EMS: 5.24mm ADNEXA:   Left ovary 2.6 x 1.2 x 1.0cm   Right ovary 3.2 x 3.0 x 3.1cm with 60mm and 6.1QA follicle/cyst CUL DE SAC: no free fluid  Exam:  Gen:  WNWD WF, NAD Lymph:  No clavicular or axillar LAD Breasts:  Symmetric, no palpable masses, no nipple discharge.  Area of tenderness on right side without any physical exam findings.  No skin changes.  Assessment:  Right ovarian cyst, pelvic pain that is improved Right breast pain  Plan: Repeat PUS in late November as pt is very anxious about this cyst Pt will let me know after next menstrual cycle if she is still having any pain and I will proceed with diagnostic imaging at that time.  ~15 minutes spent with patient >50% of time was in face to face discussion of above.

## 2015-06-29 ENCOUNTER — Encounter
Admission: RE | Disposition: A | Discharge status: Home / Self Care | Payer: Self-pay | Source: Ambulatory Visit | Attending: Unknown Physician Specialty

## 2015-06-29 ENCOUNTER — Ambulatory Visit: Payer: 59 | Admitting: Anesthesiology

## 2015-06-29 ENCOUNTER — Encounter: Payer: Self-pay | Admitting: Anesthesiology

## 2015-06-29 ENCOUNTER — Ambulatory Visit
Admission: RE | Admit: 2015-06-29 | Discharge: 2015-06-29 | Disposition: A | Discharge status: Home / Self Care | Payer: 59 | Source: Ambulatory Visit | Attending: Unknown Physician Specialty | Admitting: Unknown Physician Specialty

## 2015-06-29 DIAGNOSIS — Z881 Allergy status to other antibiotic agents status: Secondary | ICD-10-CM | POA: Insufficient documentation

## 2015-06-29 DIAGNOSIS — K640 First degree hemorrhoids: Secondary | ICD-10-CM | POA: Insufficient documentation

## 2015-06-29 DIAGNOSIS — K219 Gastro-esophageal reflux disease without esophagitis: Secondary | ICD-10-CM | POA: Insufficient documentation

## 2015-06-29 DIAGNOSIS — E039 Hypothyroidism, unspecified: Secondary | ICD-10-CM | POA: Insufficient documentation

## 2015-06-29 DIAGNOSIS — Z1211 Encounter for screening for malignant neoplasm of colon: Secondary | ICD-10-CM | POA: Diagnosis present

## 2015-06-29 DIAGNOSIS — Z800 Family history of malignant neoplasm of digestive organs: Secondary | ICD-10-CM | POA: Diagnosis not present

## 2015-06-29 DIAGNOSIS — Z86010 Personal history of colonic polyps: Secondary | ICD-10-CM | POA: Diagnosis not present

## 2015-06-29 HISTORY — PX: COLONOSCOPY WITH PROPOFOL: SHX5780

## 2015-06-29 LAB — POCT PREGNANCY, URINE: PREG TEST UR: NEGATIVE

## 2015-06-29 SURGERY — COLONOSCOPY WITH PROPOFOL
Anesthesia: General

## 2015-06-29 MED ORDER — SODIUM CHLORIDE 0.9 % IV SOLN
INTRAVENOUS | Status: DC
  Administered 2015-06-29: 10:00:00 via INTRAVENOUS

## 2015-06-29 MED ORDER — PROPOFOL 10 MG/ML IV BOLUS
INTRAVENOUS | Status: DC | PRN
  Administered 2015-06-29: 30 mg via INTRAVENOUS
  Administered 2015-06-29: 70 mg via INTRAVENOUS

## 2015-06-29 MED ORDER — ONDANSETRON HCL 4 MG/2ML IJ SOLN: 4.0000 mg | Freq: Once | INTRAMUSCULAR | Status: DC | PRN

## 2015-06-29 MED ORDER — MIDAZOLAM HCL 5 MG/5ML IJ SOLN
INTRAMUSCULAR | Status: DC | PRN
  Administered 2015-06-29: 1 mg via INTRAVENOUS

## 2015-06-29 MED ORDER — FENTANYL CITRATE (PF) 100 MCG/2ML IJ SOLN: 25.0000 ug | INTRAMUSCULAR | Status: DC | PRN

## 2015-06-29 MED ORDER — LIDOCAINE HCL (CARDIAC) 20 MG/ML IV SOLN
INTRAVENOUS | Status: DC | PRN
  Administered 2015-06-29: 50 mg via INTRAVENOUS

## 2015-06-29 MED ORDER — SODIUM CHLORIDE 0.9 % IV SOLN
INTRAVENOUS | Status: DC
  Administered 2015-06-29: 1000 mL via INTRAVENOUS

## 2015-06-29 MED ORDER — PROPOFOL INFUSION 10 MG/ML OPTIME
INTRAVENOUS | Status: DC | PRN
  Administered 2015-06-29: 120 ug/kg/min via INTRAVENOUS

## 2015-06-29 MED ORDER — FENTANYL CITRATE (PF) 100 MCG/2ML IJ SOLN
INTRAMUSCULAR | Status: DC | PRN
  Administered 2015-06-29: 50 ug via INTRAVENOUS

## 2015-06-29 NOTE — Transfer of Care (Signed)
Immediate Anesthesia Transfer of Care Note  Patient: [name]  Procedure(s) Performed: Procedure(s): COLONOSCOPY WITH PROPOFOL (N/A)  Patient Location: Endoscopy Unit  Anesthesia Type:General  Level of Consciousness: awake  Airway & Oxygen Therapy: Patient Spontanous Breathing  Post-op Assessment: Report given to RN  Post vital signs: Reviewed  Last Vitals:  Filed Vitals:   06/29/15 1021  BP: 91/45  Pulse: 56  Temp: 35.9 C  Resp: 16    Complications: No apparent anesthesia complications

## 2015-06-29 NOTE — Anesthesia Postprocedure Evaluation (Signed)
  Anesthesia Post-op Note  Patient: Nichole Flores  Procedure(s) Performed: Procedure(s): COLONOSCOPY WITH PROPOFOL (N/A)  Anesthesia type:General  Patient location: PACU  Post pain: Pain level controlled  Post assessment: Post-op Vital signs reviewed, Patient's Cardiovascular Status Stable, Respiratory Function Stable, Patent Airway and No signs of Nausea or vomiting  Post vital signs: Reviewed and stable  Last Vitals:  Filed Vitals:   06/29/15 1050  BP: 112/67  Pulse: 55  Temp:   Resp: 14    Level of consciousness: awake, alert  and patient cooperative  Complications: No apparent anesthesia complications

## 2015-06-29 NOTE — Op Note (Signed)
[facility] Gastroenterology Patient Name: Nichole Flores Procedure Date: 06/29/2015 9:36 AM MRN: [id number] Account #: [id number] Date of Birth: 12/30/1970 Admit Type: Outpatient Age: 44 Room: [facility] ENDO ROOM 4 Gender: Female Note Status: Finalized Procedure:         Colonoscopy Indications:       Personal history of colonic polyps Providers:         Manya Silvas, MD Referring MD:      Manya Silvas, MD (Referring MD) Medicines:         Propofol per Anesthesia Complications:     No immediate complications. Procedure:         Pre-Anesthesia Assessment:                    - After reviewing the risks and benefits, the patient was                     deemed in satisfactory condition to undergo the procedure.                    After obtaining informed consent, the colonoscope was                     passed under direct vision. Throughout the procedure, the                     patient's blood pressure, pulse, and oxygen saturations                     were monitored continuously. The Colonoscope was                     introduced through the anus and advanced to the the cecum,                     and the terminal ileum. identified by appendiceal orifice                     and ileocecal valve. The colonoscopy was performed without                     difficulty. The patient tolerated the procedure well. The                     quality of the bowel preparation was excellent. Findings:      Internal hemorrhoids were found during endoscopy. The hemorrhoids were       small and Grade I (internal hemorrhoids that do not prolapse).      The exam was otherwise without abnormality. Impression:        - Internal hemorrhoids.                    - The examination was otherwise normal.                    - No specimens collected. Recommendation:    - Repeat colonoscopy in 5 years for surveillance. Manya Silvas, MD 06/29/2015 10:20:33 AM This report has been  signed electronically. Number of Addenda: 0 Note Initiated On: 06/29/2015 9:36 AM Scope Withdrawal Time: 0 hours 8 minutes 50 seconds  Total Procedure Duration: 0 hours 16 minutes 9 seconds       [facility]

## 2015-06-29 NOTE — Anesthesia Preprocedure Evaluation (Signed)
Anesthesia Evaluation  Patient identified by MRN, date of birth, ID band Patient awake    Reviewed: Allergy & Precautions, NPO status , Patient's Chart, lab work & pertinent test results  Airway Mallampati: II  TM Distance: >3 FB Neck ROM: Full    Dental no notable dental hx.    Pulmonary  Allergic rhinitis breath sounds clear to auscultation  Pulmonary exam normal       Cardiovascular hypertension, Normal cardiovascular exam    Neuro/Psych Anxiety negative neurological ROS     GI/Hepatic Neg liver ROS, GERD-  Medicated and Controlled,Colon polyp Hx and colitis Hx   Endo/Other  Hypothyroidism Hx of malignant neoplasm of thyroid  Renal/GU negative Renal ROS  negative genitourinary   Musculoskeletal negative musculoskeletal ROS (+)   Abdominal Normal abdominal exam  (+)   Peds negative pediatric ROS (+)  Hematology negative hematology ROS (+)   Anesthesia Other Findings   Reproductive/Obstetrics                             Anesthesia Physical Anesthesia Plan  ASA: II  Anesthesia Plan: General   Post-op Pain Management:    Induction: Intravenous  Airway Management Planned: Nasal Cannula  Additional Equipment:   Intra-op Plan:   Post-operative Plan:   Informed Consent: I have reviewed the patients History and Physical, chart, labs and discussed the procedure including the risks, benefits and alternatives for the proposed anesthesia with the patient or authorized representative who has indicated his/her understanding and acceptance.   Dental advisory given  Plan Discussed with: CRNA and Surgeon  Anesthesia Plan Comments:         Anesthesia Quick Evaluation

## 2015-06-29 NOTE — H&P (Signed)
Primary Care Physician:  Aura Dials, PA-C Primary Gastroenterologist:  Dr. Vira Agar  Pre-Procedure History & Physical: HPI:  Nichole Flores is a 44 y.o. female is here for an colonoscopy.   Past Medical History  Diagnosis Date  . ALLERGIC RHINITIS 07/26/2009  . ELEVATED BLOOD PRESSURE 08/22/2009  . GERD 08/22/2009  . HYPOTHYROIDISM 07/26/2009  . 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 Gastritis   . Ovarian cyst   . Perimenopausal     Past Surgical History  Procedure Laterality Date  . Thyroidectomy  08-2002    [place]  . Colonoscopy  June 2009/ 03-2012    per Dr. Sumner Boast at [facility] , repeat 3 yrs     Prior to Admission medications   Medication Sig Start Date End Date Taking? Authorizing Provider  cetirizine (ZYRTEC) 10 MG tablet Take 10 mg by mouth daily.     Yes Historical Provider, MD  Levothyroxine Sodium (TIROSINT) 125 MCG CAPS Take by mouth daily before breakfast.   Yes Historical Provider, MD  liothyronine (CYTOMEL) 5 MCG tablet Take 35 mcg by mouth daily. 20 mcg in the am and 15 in the pm   Yes Historical Provider, MD  Biotin 5000 MCG CAPS Take by mouth daily.    Historical Provider, MD  Cholecalciferol (VITAMIN D) 2000 UNITS tablet Take 2,000 Units by mouth daily.    Historical Provider, MD  Magnesium 250 MG TABS Take 250 mg by mouth daily.    Historical Provider, MD  Omega-3 Fatty Acids (FISH OIL PO) Take by mouth daily.    Historical Provider, MD  Vilazodone HCl (VIIBRYD PO) Take 30 mg by mouth daily.    Historical Provider, MD  VITAMIN K PO Take by mouth daily.    Historical Provider, MD    Allergies as of 06/15/2015 - Review Complete 05/17/2015  Allergen Reaction Noted  . Doxycycline Itching 03/21/2011    Family History  Problem Relation Age of Onset  . Parkinson's disease Maternal Grandmother   . Throat cancer Maternal Grandmother     smoker  . Colon cancer Other 24    maternal uncle   . Lung cancer Maternal Grandfather     smoker  . Breast cancer Other     9--maternal great aunts (maternal grandfather's sisters)  . ALS Mother 72    died age 45  . Cancer Mother     Spinal Cord  . Hypertension Father     Social History   Social History  . Marital Status: Married    Spouse Name: N/A  . Number of Children: N/A  . Years of Education: N/A   Occupational History  . Not on file.   Social History Main Topics  . Smoking status: Never Smoker   . Smokeless tobacco: Never Used  . Alcohol Use: 1.8 oz/week    3 Glasses of wine per week  . Drug Use: No  . Sexual Activity: Yes     Comment: vasectomy   Other Topics Concern  . Not on file   Social History Narrative    Review of Systems: See HPI, otherwise negative ROS  Physical Exam: BP 135/89 mmHg  Pulse 63  Temp(Src) 97.3 F (36.3 C) (Tympanic)  Resp 16  Ht 5\' 6"  (1.676 m)  Wt 77.111 kg (170 lb)  BMI 27.45 kg/m2  SpO2 100% General:   Alert,  pleasant and cooperative in NAD Head:  Normocephalic and atraumatic. Neck:  Supple; no masses or thyromegaly.  Lungs:  Clear throughout to auscultation.    Heart:  Regular rate and rhythm. Abdomen:  Soft, nontender and nondistended. Normal bowel sounds, without guarding, and without rebound.   Neurologic:  Alert and  oriented x4;  grossly normal neurologically.  Impression/Plan: Nichole Flores is here for an colonoscopy to be performed for Personal hx of colon polyps  Risks, benefits, limitations, and alternatives regarding  colonoscopy have been reviewed with the patient.  Questions have been answered.  All parties agreeable.   Gaylyn Cheers, MD  06/29/2015, 9:56 AM

## 2015-09-15 ENCOUNTER — Ambulatory Visit (INDEPENDENT_AMBULATORY_CARE_PROVIDER_SITE_OTHER): Payer: 59

## 2015-09-15 ENCOUNTER — Ambulatory Visit (INDEPENDENT_AMBULATORY_CARE_PROVIDER_SITE_OTHER): Payer: 59 | Admitting: Obstetrics & Gynecology

## 2015-09-15 VITALS — BP 120/78 | HR 60 | Resp 16 | Wt 174.0 lb

## 2015-09-15 DIAGNOSIS — N83201 Unspecified ovarian cyst, right side: Secondary | ICD-10-CM

## 2015-09-15 DIAGNOSIS — K2990 Gastroduodenitis, unspecified, without bleeding: Secondary | ICD-10-CM

## 2015-09-15 DIAGNOSIS — K2970 Gastritis, unspecified, without bleeding: Secondary | ICD-10-CM | POA: Diagnosis not present

## 2015-09-15 NOTE — Progress Notes (Signed)
44 y.o.Marriedfemale here for a pelvic ultrasound to recheck ovarian cyst.  Pt reports she is not having any pelvic pain but she is really having issues with gastritis and epigastric pain.  Lots of stress as her husband moved to [place] and she is here with kids.  He's not "in love" with his job and just wants their family to be together.  Oldest son has applied to [employer] and [employer] and depending on where he gets in, they may either be considering looking for work again or moving.  She really doesn't want to do this.    Had colonoscopy done late August with Dr. Vira Agar at [place] [facility].  Note is in EPIC.  No polyps just internal hemorrhoids.  Follow up 5 years due to hx of polyps.  Not sure what to do about the gastritis.  Is on Aciphex and carafate and if she misses a dose, she can really tell the next day.  LMP:  08/19/15  Sexually active:  yes but husband is in [place] with work right now Contraception: vasectomy  FINDINGS: UTERUS: 10.9 x 7.2 x 5.4cm EMS: 14.29mm, symmetric ADNEXA:   Left ovary 2.5 x 1.3 x 1.2cm   Right ovary 3.1 x 2.2 x 2.2cm with 2.0cm thin walled cyst that has minimal change since 8/16.  Cyst is avascular and echofree CUL DE SAC: no free fluid noted  Reviewed findings with pt.  As cyst has not resolved but is stable, will plan to repeat PUS in six months.  If stable again, could consider rechecking on a yearly basis at that point.  Pt is clearly aware that I am not a GI specialist.  But given amt of epigastric pain she is having, feel checking stool for blood and for H Pylori is prudent.  Information regarding tests given.  Pt will return samples hopefully within the next few days.  Assessment: Stable 2.0 right ovarian cyst Gastritis with epigastric pain.  Negative colonoscopy 8/16.  Plan: H pylori antigen stool test ordered OC light given Pt will return for repeat PUS in six months.  ~25 minutes spent with patient >50% of time was in face to face  discussion of above.

## 2015-09-18 ENCOUNTER — Encounter: Payer: Self-pay | Admitting: Obstetrics & Gynecology

## 2015-09-20 ENCOUNTER — Other Ambulatory Visit (INDEPENDENT_AMBULATORY_CARE_PROVIDER_SITE_OTHER): Payer: 59

## 2015-09-20 ENCOUNTER — Other Ambulatory Visit: Payer: Self-pay

## 2015-09-20 DIAGNOSIS — K2990 Gastroduodenitis, unspecified, without bleeding: Principal | ICD-10-CM

## 2015-09-20 DIAGNOSIS — K2970 Gastritis, unspecified, without bleeding: Secondary | ICD-10-CM

## 2015-09-21 LAB — HELICOBACTER PYLORI  SPECIAL ANTIGEN: H. PYLORI Antigen: NOT DETECTED

## 2015-10-07 ENCOUNTER — Ambulatory Visit (INDEPENDENT_AMBULATORY_CARE_PROVIDER_SITE_OTHER): Payer: 59 | Admitting: Obstetrics & Gynecology

## 2015-10-07 ENCOUNTER — Encounter: Payer: Self-pay | Admitting: Obstetrics & Gynecology

## 2015-10-07 VITALS — BP 124/60 | HR 62 | Resp 18 | Ht 66.25 in | Wt 175.0 lb

## 2015-10-07 DIAGNOSIS — Z01419 Encounter for gynecological examination (general) (routine) without abnormal findings: Secondary | ICD-10-CM

## 2015-10-07 DIAGNOSIS — Z124 Encounter for screening for malignant neoplasm of cervix: Secondary | ICD-10-CM

## 2015-10-07 DIAGNOSIS — Z0000 Encounter for general adult medical examination without abnormal findings: Secondary | ICD-10-CM

## 2015-10-07 LAB — POCT URINALYSIS DIPSTICK
BILIRUBIN UA: NEGATIVE
Blood, UA: NEGATIVE
GLUCOSE UA: NEGATIVE
KETONES UA: NEGATIVE
LEUKOCYTES UA: NEGATIVE
NITRITE UA: NEGATIVE
PH UA: 7
Protein, UA: NEGATIVE
Urobilinogen, UA: NEGATIVE

## 2015-10-07 NOTE — Progress Notes (Signed)
44 y.o. [name] MarriedCaucasianF here for annual exam.  Doing well.  Pt reports her reflux is better but she is taking the Aciphex and Carafate.  Son will find out tonight if he gets in to [facility].  Will find out about [facility] in January.    Husband has another job opportunity back in this area.    Cycles are regular.    Endocrinologist.  Lavone Orn, MD  Patient's last menstrual period was 09/23/2015.          Sexually active: Yes.    The current method of family planning is vasectomy.    Exercising: Yes.    Hot Yoga Smoker:  no  Health Maintenance: Pap:  08/13/14 Neg. HR HPV:neg History of abnormal Pap:  Yes, 2012 MMG:  05/17/15 BIRADS1:neg Colonoscopy:  06/29/15 Hemorrhoids -Repeat 5 years  BMD:   2005 TDaP:  2007 Screening Labs: PCP, Hb today: PCP, Urine today: Negative   reports that she has never smoked. She has never used smokeless tobacco. She reports that she drinks about 1.8 oz of alcohol per week. She reports that she does not use illicit drugs.  Past Medical History  Diagnosis Date  . ALLERGIC RHINITIS 07/26/2009  . ELEVATED BLOOD PRESSURE 08/22/2009  . GERD 08/22/2009  . HYPOTHYROIDISM 07/26/2009  . 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 Gastritis   . Ovarian cyst   . Perimenopausal     Past Surgical History  Procedure Laterality Date  . Thyroidectomy  08-2002    [place]  . Colonoscopy  June 2009/ 03-2012    per Dr. Sumner Boast at [facility] , repeat 3 yrs   . Colonoscopy with propofol N/A 06/29/2015    Procedure: COLONOSCOPY WITH PROPOFOL;  Surgeon: Manya Silvas, MD;  Location: [facility] ENDOSCOPY;  Service: Endoscopy;  Laterality: N/A;    Current Outpatient Prescriptions  Medication Sig Dispense Refill  . cetirizine (ZYRTEC) 10 MG tablet Take 10 mg by mouth daily.      Marland Kitchen FLUoxetine (PROZAC) 20 MG capsule Take by mouth.    . liothyronine (CYTOMEL) 5 MCG tablet Take 35 mcg by mouth daily. 20  mcg in the am and 15 in the pm    . Omega-3 Fatty Acids (FISH OIL PO) Take by mouth daily.    . RABEprazole (ACIPHEX) 20 MG tablet Take 20 mg by mouth.    . sucralfate (CARAFATE) 1 G tablet Take by mouth.    [name] 112 MCG CAPS Take 1 capsule by mouth daily.     No current facility-administered medications for this visit.    Family History  Problem Relation Age of Onset  . Parkinson's disease Maternal Grandmother   . Throat cancer Maternal Grandmother     smoker  . Colon cancer Other 83    maternal uncle  . Lung cancer Maternal Grandfather     smoker  . Breast cancer Other     9--maternal great aunts (maternal grandfather's sisters)  . ALS Mother 11    died age 70  . Cancer Mother     Spinal Cord  . Hypertension Father     ROS:  Pertinent items are noted in HPI.  Otherwise, a comprehensive ROS was negative.  Exam:   BP 124/60 mmHg  Pulse 62  Resp 18  Ht 5' 6.25" (1.683 m)  Wt 175 lb (79.379 kg)  BMI 28.02 kg/m2  LMP 09/23/2015  Weight change: +1#  Height: 5' 6.25" (168.3 cm)  Ht Readings from Last 3 Encounters:  10/07/15 5' 6.25" (1.683 m)  06/29/15 5\' 6"  (1.676 m)  08/13/14 5\' 6"  (1.676 m)    General appearance: alert, cooperative and appears stated age Head: Normocephalic, without obvious abnormality, atraumatic Neck: no adenopathy, supple, symmetrical, trachea midline and thyroid normal to inspection and palpation Lungs: clear to auscultation bilaterally Breasts: normal appearance, no masses or tenderness Heart: regular rate and rhythm Abdomen: soft, non-tender; bowel sounds normal; no masses,  no organomegaly Extremities: extremities normal, atraumatic, no cyanosis or edema Skin: Skin color, texture, turgor normal. No rashes or lesions Lymph nodes: Cervical, supraclavicular, and axillary nodes normal. No abnormal inguinal nodes palpated Neurologic: Grossly normal   Pelvic: External genitalia:  no lesions              Urethra:  normal appearing urethra  with no masses, tenderness or lesions              Bartholins and Skenes: normal                 Vagina: normal appearing vagina with normal color and discharge, no lesions              Cervix: no lesions              Pap taken: Yes.   Bimanual Exam:  Uterus:  normal size, contour, position, consistency, mobility, non-tender              Adnexa: normal adnexa and no mass, fullness, tenderness               Rectovaginal: Confirms               Anus:  normal sphincter tone, no lesions  Chaperone was present for exam.  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   Family hx of breast and colon cancer  H/O OCD symptoms/anxiety  P: Mammogram yearly Colonoscopy due every 5 year Considering changing endocrinologists this year An After Visit Summary was printed and given to the patient.

## 2015-10-10 LAB — IPS PAP TEST WITH REFLEX TO HPV

## 2015-10-12 ENCOUNTER — Encounter: Payer: Self-pay | Admitting: Obstetrics & Gynecology

## 2015-12-19 ENCOUNTER — Encounter: Payer: Self-pay | Admitting: Obstetrics & Gynecology

## 2015-12-30 NOTE — Progress Notes (Signed)
Pt did not return IFOB sample.  Letter was sent.

## 2016-02-16 DIAGNOSIS — F988 Other specified behavioral and emotional disorders with onset usually occurring in childhood and adolescence: Secondary | ICD-10-CM | POA: Insufficient documentation

## 2016-04-23 ENCOUNTER — Other Ambulatory Visit: Payer: Self-pay | Admitting: Obstetrics & Gynecology

## 2016-04-23 DIAGNOSIS — Z1231 Encounter for screening mammogram for malignant neoplasm of breast: Secondary | ICD-10-CM

## 2016-05-18 ENCOUNTER — Ambulatory Visit
Admission: RE | Admit: 2016-05-18 | Discharge: 2016-05-18 | Disposition: A | Discharge status: Home / Self Care | Payer: 59 | Source: Ambulatory Visit | Attending: Obstetrics & Gynecology | Admitting: Obstetrics & Gynecology

## 2016-05-18 DIAGNOSIS — Z1231 Encounter for screening mammogram for malignant neoplasm of breast: Secondary | ICD-10-CM

## 2016-08-01 ENCOUNTER — Encounter: Payer: Self-pay | Admitting: Obstetrics & Gynecology

## 2017-01-03 ENCOUNTER — Encounter: Payer: Self-pay | Admitting: Obstetrics & Gynecology

## 2017-01-08 ENCOUNTER — Telehealth: Payer: Self-pay

## 2017-01-08 NOTE — Telephone Encounter (Signed)
Non-Urgent Medical Question  Message [id number]  From Nichole Flores To [name], MD Sent 01/03/2017 3:45 PM  Hi Dr Sabra Heck-   Just checking in with you regarding my periods getting closer together. The past 3 months I've been getting them every 20-22 days, which is close together for me. This is accompanied by more cramping than usual, too. Could this be a sign peri menopause is approaching or yet another ovarian cyst? I'm coming for my annual exam in a month so nothing urgent but if you want to do an u/s thought I could schedule it for the same day. Hopefully no need for concern but wanted to check.   Thank you as always,  Nichole Flores   Responsible Party   Pool - [facility] Clinical Pool Message taken by Gwendlyn Deutscher, RN on 01/08/2017 12:32 PM  No actions have been taken on this message.   Dr.Miller, patient is scheduled for 02/01/2017 for aex. THis is a Friday. Would you like me to adjust the appointment for her to have PUS and aex? Or keep aex as scheduled and schedule further evaluation as needed?

## 2017-01-08 NOTE — Telephone Encounter (Signed)
Telephone encounter created to review with Dr.Miller. 

## 2017-01-09 NOTE — Telephone Encounter (Signed)
Please let her know I don't think she needs a repeat PUS.  I will do some hormonal blood work the day she comes for her visit and we can review her cycles and decide for additional testing based on exam findings and her lab work.  Thanks.

## 2017-01-09 NOTE — Telephone Encounter (Signed)
Spoke with patient. Advised of message as seen below from [place]. Patient verbalizes understanding. Patient states that she has been seeing a new Endocrinologist with [facility] and has been having a lot of labs checked. Had [facility], LH, and Estradiol checked in February and March.  Results are available in Care Everywhere. On 12/05/2016 FSH 2.4, LH 0.4, Estradiol <20. On 01/07/2017 FSH 10.6, LH 6.1, Estradiol 83. Asking if based on these results Dr.Miller wants her to do anything before her aex. Advised will review with Dr.Miller and return call.

## 2017-01-11 NOTE — Telephone Encounter (Signed)
Call to patient. Update provided from Dr Sabra Heck. Patient will keep annual exam as scheduled.    Encounter closed.

## 2017-01-11 NOTE — Telephone Encounter (Signed)
No additional recommendations at this time.  I do not think ultrasound needed at this time.

## 2017-02-01 ENCOUNTER — Ambulatory Visit: Payer: 59 | Admitting: Obstetrics & Gynecology

## 2017-02-05 ENCOUNTER — Ambulatory Visit (INDEPENDENT_AMBULATORY_CARE_PROVIDER_SITE_OTHER): Payer: 59 | Admitting: Obstetrics & Gynecology

## 2017-02-05 ENCOUNTER — Other Ambulatory Visit (HOSPITAL_COMMUNITY)
Admission: RE | Admit: 2017-02-05 | Discharge: 2017-02-05 | Disposition: A | Discharge status: Home / Self Care | Payer: 59 | Source: Ambulatory Visit | Attending: Obstetrics & Gynecology | Admitting: Obstetrics & Gynecology

## 2017-02-05 ENCOUNTER — Encounter: Payer: Self-pay | Admitting: Obstetrics & Gynecology

## 2017-02-05 VITALS — BP 120/72 | HR 68 | Resp 16 | Ht 66.25 in | Wt 183.0 lb

## 2017-02-05 DIAGNOSIS — R790 Abnormal level of blood mineral: Secondary | ICD-10-CM

## 2017-02-05 DIAGNOSIS — Z124 Encounter for screening for malignant neoplasm of cervix: Secondary | ICD-10-CM | POA: Diagnosis not present

## 2017-02-05 DIAGNOSIS — E890 Postprocedural hypothyroidism: Secondary | ICD-10-CM | POA: Diagnosis not present

## 2017-02-05 DIAGNOSIS — Z01419 Encounter for gynecological examination (general) (routine) without abnormal findings: Secondary | ICD-10-CM

## 2017-02-05 LAB — CBC
HEMATOCRIT: 40.4 % (ref 35.0–45.0)
HEMOGLOBIN: 13.3 g/dL (ref 11.7–15.5)
MCH: 30.2 pg (ref 27.0–33.0)
MCHC: 32.9 g/dL (ref 32.0–36.0)
MCV: 91.6 fL (ref 80.0–100.0)
MPV: 9.6 fL (ref 7.5–12.5)
Platelets: 323 10*3/uL (ref 140–400)
RBC: 4.41 MIL/uL (ref 3.80–5.10)
RDW: 13.3 % (ref 11.0–15.0)
WBC: 4.9 10*3/uL (ref 3.8–10.8)

## 2017-02-05 LAB — T4, FREE: FREE T4: 1 ng/dL (ref 0.8–1.8)

## 2017-02-05 LAB — T3, FREE: T3, Free: 2.4 pg/mL (ref 2.3–4.2)

## 2017-02-05 LAB — FERRITIN: Ferritin: 23 ng/mL (ref 10–232)

## 2017-02-05 LAB — TSH: TSH: 0.24 mIU/L — ABNORMAL LOW

## 2017-02-05 NOTE — Progress Notes (Signed)
46 y.o. [id number] MarriedCaucasianF here for annual exam.  Doing well.  Still cycling but cycles for about three months, entire cycle was 21 days.  Flow is shorter and about 3 days and lighter.  Had [facility] obtained about a month ago.  Was a little over 10.  Had thyroid ultrasound done 2/18 showing a 2cm lymph node.  Saw ENT at [facility].  Will have follow up in a year.  No FNA was done.  PCP:  Roe Coombs, [facility] Endocrinologist:  Dr. Oren Section, [facility]  Patient's last menstrual period was 01/30/2017.          Sexually active: Yes.    The current method of family planning is vasectomy.    Exercising: Yes.    spinning Smoker:  no  Health Maintenance: Pap:  10/07/15 Pap Neg; 08/13/14 Neg. HR HPV:neg; 03/12/13 Pap Neg -- results in EPIC History of abnormal Pap:  Yes, 2012 MMG:  05/18/16 BIRADS 1 Neg/density b Colonoscopy:  06/29/15 Hemorrhoids -Repeat 5 yeara BMD:   2005 TDaP:  2017 Screening Labs: discuss today   reports that she has never smoked. She has never used smokeless tobacco. She reports that she drinks about 1.8 oz of alcohol per week . She reports that she does not use drugs.  Past Medical History:  Diagnosis Date  . ALLERGIC RHINITIS 07/26/2009  . ELEVATED BLOOD PRESSURE 08/22/2009  . Gastritis   . GERD 08/22/2009  . HYPOTHYROIDISM 07/26/2009  . 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 Ovarian cyst   . Perimenopausal   . 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 COLONOSCOPY  June 2009/ 03-2012   per Dr. Sumner Boast at [facility] , repeat 3 yrs   . COLONOSCOPY WITH PROPOFOL N/A 06/29/2015   Procedure: COLONOSCOPY WITH PROPOFOL;  Surgeon: Manya Silvas, MD;  Location: [facility] ENDOSCOPY;  Service: Endoscopy;  Laterality: N/A;  . THYROIDECTOMY  08-2002   [place]    Current Outpatient Prescriptions  Medication Sig Dispense Refill  . cetirizine (ZYRTEC) 10 MG tablet Take 10 mg by mouth daily.      Marland Kitchen FLUoxetine (PROZAC) 40  MG capsule Take 40 mg by mouth.     . liothyronine (CYTOMEL) 5 MCG tablet Take 35 mcg by mouth daily. 20 mcg in the am and 15 in the pm    . TIROSINT 112 MCG CAPS Take 1 capsule by mouth daily. Take 150mg  M-Saturday then take 300mg  on Sunday     No current facility-administered medications for this visit.     Family History  Problem Relation Age of Onset  . ALS Mother 35    died age 67  . Cancer Mother     Spinal Cord  . Hypertension Father   . Parkinson's disease Maternal Grandmother   . Throat cancer Maternal Grandmother     smoker  . Colon cancer Other 50    maternal uncle  . Lung cancer Maternal Grandfather     smoker  . Breast cancer Other     9--maternal great aunts (maternal grandfather's sisters)    ROS:  Pertinent items are noted in HPI.  Otherwise, a comprehensive ROS was negative.  Exam:   BP 120/72 (BP Location: Right Arm, Patient Position: Sitting, Cuff Size: Normal)   Pulse 68   Resp 16   Ht 5' 6.25" (1.683 m)   Wt 183 lb (83 kg)   LMP 01/30/2017   BMI 29.31 kg/m   Weight change:  +8#  Height: 5' 6.25" (168.3 cm)  Ht Readings from Last 3 Encounters:  02/05/17 5' 6.25" (1.683 m)  10/07/15 5' 6.25" (1.683 m)  06/29/15 5\' 6"  (1.676 m)    General appearance: alert, cooperative and appears stated age Head: Normocephalic, without obvious abnormality, atraumatic Neck: no adenopathy, supple, symmetrical, trachea midline and thyroid normal to inspection and palpation Lungs: clear to auscultation bilaterally Breasts: normal appearance, no masses or tenderness Heart: regular rate and rhythm Abdomen: soft, non-tender; bowel sounds normal; no masses,  no organomegaly Extremities: extremities normal, atraumatic, no cyanosis or edema Skin: Skin color, texture, turgor normal. No rashes or lesions Lymph nodes: Cervical, supraclavicular, and axillary nodes normal. No abnormal inguinal nodes palpated Neurologic: Grossly normal   Pelvic: External genitalia:  no  lesions              Urethra:  normal appearing urethra with no masses, tenderness or lesions              Bartholins and Skenes: normal                 Vagina: normal appearing vagina with normal color and discharge, no lesions              Cervix: no lesions              Pap taken: Yes.   Bimanual Exam:  Uterus:  normal size, contour, position, consistency, mobility, non-tender              Adnexa: normal adnexa and no mass, fullness, tenderness               Rectovaginal: Confirms               Anus:  normal sphincter tone, no lesions  Chaperone was present for exam.  A:  Well Woman with normal exam Hypothyroidism due ot h/o thyroidism Family hx of breast and colon cancer H/O OCD symptoms/anxiety  P:   Mammogram guidelines reviewed.  She is going to do this at [facility] this year pap smear obtained today at pt's request.  Plan pap and HR HPV in 1 year Free T3 and Free T4, TSH.  Pt desires lab work to be sent to Dr. Oren Section Ferritin and CBC Return annually or prn

## 2017-02-06 LAB — CYTOLOGY - PAP: Diagnosis: NEGATIVE

## 2017-02-07 ENCOUNTER — Encounter: Payer: Self-pay | Admitting: Obstetrics & Gynecology

## 2017-02-07 ENCOUNTER — Telehealth: Payer: Self-pay

## 2017-02-07 NOTE — Telephone Encounter (Signed)
Visit Follow-Up Question  Message [id number]  From Nichole Flores To [name], MD Sent 02/07/2017 12:26 PM  Hi Dr Sabra Heck-  Sorry to be a pest- but wondering if my labs have come back yet?? I am due for my thyroid medicine refill by Monday and my endocrinologist is awaiting the results before she orders a refill. Thank you!!   Responsible Party   Pool - [facility] Clinical Pool No one has taken responsibility for this message.  No actions have been taken on this message.   Dr.Miller, labs from 02/05/2017 are ferritin 23 normal, t 3 free 2.4 normal, T 4 free 1.0 normal, TSH 0.24 low, CBC normal, and pap negative. Any additional information for patient at this time?

## 2017-02-07 NOTE — Telephone Encounter (Signed)
Please see telephone encounter dated 02/07/2017.

## 2017-02-09 NOTE — Telephone Encounter (Signed)
Routed pt the results of her testing via mycahrt.  Ok to close encounter.  Thanks.

## 2017-02-17 ENCOUNTER — Encounter: Payer: Self-pay | Admitting: Obstetrics & Gynecology

## 2017-02-18 ENCOUNTER — Other Ambulatory Visit: Payer: Self-pay | Admitting: Obstetrics & Gynecology

## 2017-02-18 MED ORDER — FLUCONAZOLE 150 MG PO TABS: 150.0000 mg | ORAL_TABLET | Freq: Once | ORAL | 0 refills | Status: AC

## 2017-04-02 ENCOUNTER — Encounter: Payer: Self-pay | Admitting: Obstetrics & Gynecology

## 2017-04-04 ENCOUNTER — Telehealth: Payer: Self-pay | Admitting: *Deleted

## 2017-04-04 NOTE — Telephone Encounter (Signed)
See telephone encounter dated 04/04/17.

## 2017-04-04 NOTE — Telephone Encounter (Signed)
Spoke with patient, recommended OV for further discussion with Dr. Sabra Heck. Patient scheduled for 04/09/17 at 2:30pm with Dr. Sabra Heck. Patient is agreeable to date and time.  Routing to provider for final review. Patient is agreeable to disposition. Will close encounter.      Non-Urgent Medical Question  Message [phone]  From [name] To [name], MD Sent 04/02/2017 7:19 PM  Hi Dr Sabra Heck-  Today I had my six month follow up visit with Dr Sterling Big at [facility]. She is a benign hematologist who treats me for iron deficient anemia. I had my first iron infusion in December after my ferritin went down to 8. At my follow up today my ferritin is at 13 so I have another iron infusion in my near future. When I asked her what is causing this she said most likely it is due to blood loss from my periods. She said one solution for this is to have the Mirena IUD implanted. I wanted to check with you about this because I have never used an IUD.are there any side effects? Any long term risks? Since my periods are not particularly heavy I am somewhat surprised that I could become so anemic from this. Also since I am 46 I am hoping they'll taper off soon. The alternative to the IUD is fairly regular infusions.   Can you give me any insight into this? What are your thoughts? She is not leaning one way or the other but just giving me options.   Thanks,  Nichole Flores

## 2017-04-09 ENCOUNTER — Ambulatory Visit (INDEPENDENT_AMBULATORY_CARE_PROVIDER_SITE_OTHER): Payer: 59 | Admitting: Obstetrics & Gynecology

## 2017-04-09 VITALS — BP 120/76 | HR 64 | Resp 16 | Ht 66.25 in | Wt 183.0 lb

## 2017-04-09 DIAGNOSIS — R790 Abnormal level of blood mineral: Secondary | ICD-10-CM | POA: Diagnosis not present

## 2017-04-09 DIAGNOSIS — Z1211 Encounter for screening for malignant neoplasm of colon: Secondary | ICD-10-CM | POA: Diagnosis not present

## 2017-04-09 NOTE — Progress Notes (Signed)
GYNECOLOGY  VISIT   HPI: 46 y.o. G13P2000 Married Caucasian female here for discussion of possible IUD use due to low ferritin level.  Pt reports significant fatigue over the past few years.  She is being followed by hematologis, Dr. Gaylyn Cheers with [facility], who has given her one iron infusion (12/17) without significant change in her ferritin level.  Most recent ferritin level was 13.9.  Pt reports she was advised she's either having too much menstrual bleeding or there is something going on from GI perspective.  No changes in stool color or characteristics.  Colonoscopy was negative two years ago.    Cycles are regular.  Typically they last 5-6 days.  Pt reports the flow is not typically heavy for her.  She does not think her cycles are what one would describe at "heavy" and she's not really sure she needs "treatment" for this.  Mirena IUD was recommended by hematologist as well.  Wants to discuss this today.    Has questions about why ferritin could be low even after iron transfusion.   GYNECOLOGIC HISTORY: Patient's last menstrual period was 04/02/2017. Contraception: vasectomy  Patient Active Problem List   Diagnosis Date Noted  . ADD (attention deficit disorder) 02/16/2016  . Anxiety 05/19/2015  . History of thyroid cancer 10/01/2014  . 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 Cyst of ovary 12/23/2013  . Family history of malignant neoplasm of breast 12/23/2013  . Family hx of colon cancer 12/23/2013  . Family history of cancer of digestive organ 12/23/2013  . Polyp of colon 04/12/2011  . INFLUENZA 12/16/2009  . GERD 08/22/2009  . ELEVATED BLOOD PRESSURE 08/22/2009  . 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 Acquired hypothyroidism 07/26/2009  . ALLERGIC RHINITIS 07/26/2009  . SHINGLES, HX OF 07/26/2009    Past Medical History:  Diagnosis Date  . ALLERGIC RHINITIS 07/26/2009  . ELEVATED BLOOD PRESSURE 08/22/2009  . Gastritis   . GERD 08/22/2009  .  HYPOTHYROIDISM 07/26/2009  . 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 Ovarian cyst   . Perimenopausal   . 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 COLONOSCOPY  June 2009/ 03-2012   per Dr. Sumner Boast at [facility] , repeat 3 yrs   . COLONOSCOPY WITH PROPOFOL N/A 06/29/2015   Procedure: COLONOSCOPY WITH PROPOFOL;  Surgeon: Manya Silvas, MD;  Location: [facility] ENDOSCOPY;  Service: Endoscopy;  Laterality: N/A;  . THYROIDECTOMY  08-2002   [place]    MEDS:  Reviewed in EPIC and UTD  ALLERGIES: Doxycycline  Family History  Problem Relation Age of Onset  . ALS Mother 18       died age 31  . Cancer Mother        Spinal Cord  . Hypertension Father   . Parkinson's disease Maternal Grandmother   . Throat cancer Maternal Grandmother        smoker  . Colon cancer Other 83       maternal uncle  . Lung cancer Maternal Grandfather        smoker  . Breast cancer Other        9--maternal great aunts (maternal grandfather's sisters)    SH:  Married, non smoker  Review of Systems  Constitutional: Positive for malaise/fatigue.  All other systems reviewed and are negative.   PHYSICAL EXAMINATION:    BP 120/76 (BP Location: Right Arm, Patient Position: Sitting, Cuff Size: Normal)   Pulse 64  Resp 16   Ht 5' 6.25" (1.683 m)   Wt 183 lb (83 kg)   LMP 04/02/2017   BMI 29.31 kg/m     Physical Exam  Constitutional: She is oriented to person, place, and time. She appears well-developed and well-nourished.  Neurological: She is alert and oriented to person, place, and time.  Skin: Skin is warm and dry.  Psychiatric: She has a normal mood and affect.  No other physical exam performed today  Assessment: Low ferritin possibly causing chronic fatigue  Plan: D/w pt doing IFOB testing.  If positive, would recommend repeat GI evaluation.   Mirena IUD procedure, risks, benefits, alternatives reviewed.  Information  provided.  She would like to know cost. Possible need for additional iron infusions as well as possible malabsorption of iron discussed as possible cause of low iron discussed.  Questions answered to best of my ability.   ~25 minutes spent with patient >50% of time was in face to face discussion of above.

## 2017-04-11 LAB — FECAL OCCULT BLOOD, IMMUNOCHEMICAL: IFOBT: NEGATIVE

## 2017-04-12 ENCOUNTER — Encounter: Payer: Self-pay | Admitting: Obstetrics & Gynecology

## 2018-02-10 ENCOUNTER — Ambulatory Visit (INDEPENDENT_AMBULATORY_CARE_PROVIDER_SITE_OTHER): Payer: 59 | Admitting: Obstetrics & Gynecology

## 2018-02-10 ENCOUNTER — Other Ambulatory Visit: Payer: Self-pay

## 2018-02-10 ENCOUNTER — Other Ambulatory Visit (HOSPITAL_COMMUNITY)
Admission: RE | Admit: 2018-02-10 | Discharge: 2018-02-10 | Disposition: A | Discharge status: Home / Self Care | Payer: 59 | Source: Ambulatory Visit | Attending: Obstetrics & Gynecology | Admitting: Obstetrics & Gynecology

## 2018-02-10 ENCOUNTER — Encounter: Payer: Self-pay | Admitting: Obstetrics & Gynecology

## 2018-02-10 VITALS — BP 116/68 | HR 60 | Resp 14 | Ht 66.25 in | Wt 190.0 lb

## 2018-02-10 DIAGNOSIS — Z01419 Encounter for gynecological examination (general) (routine) without abnormal findings: Secondary | ICD-10-CM | POA: Diagnosis not present

## 2018-02-10 DIAGNOSIS — Z124 Encounter for screening for malignant neoplasm of cervix: Secondary | ICD-10-CM

## 2018-02-10 NOTE — Progress Notes (Addendum)
47 y.o. G2P2000 MarriedCaucasianF here for annual exam.  Has been diagnosed with mild OSA and cholesterol has increased.  Has started Saxenda for weight loss.  Would like to lose about 40-45 pounds.  Feeling some hunger.    Cycles are regular.  Flow is heavy for the first day.    Patient's last menstrual period was 02/03/2018.          Sexually active: Yes.    The current method of family planning is vasectomy.    Exercising: Yes.    walk/ cycle Smoker:  no  Health Maintenance: Pap:  02/05/17 Neg   10/07/15 Neg  History of abnormal Pap:  yes MMG:  09/25/17 BIRADS1:Neg -- [facility] Colonoscopy:  06/29/15 Normal. F/u 5 years  BMD:   Never TDaP:  2017 Pneumonia vaccine(s):  no Shingrix:   No Hep C testing: not indicated Screening Labs: Endo does labs    reports that she has never smoked. She has never used smokeless tobacco. She reports that she drinks about 1.2 oz of alcohol per week. She reports that she does not use drugs.  Past Medical History:  Diagnosis Date  . ALLERGIC RHINITIS 07/26/2009  . ELEVATED BLOOD PRESSURE 08/22/2009  . Gastritis   . GERD 08/22/2009  . Hyperlipidemia   . HYPOTHYROIDISM 07/26/2009  . Low iron stores   . 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 Ovarian cyst   . Perimenopausal   . 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 Sleep apnea     Past Surgical History:  Procedure Laterality Date  . COLONOSCOPY  June 2009/ 03-2012   per Dr. Sumner Boast at [facility] , repeat 3 yrs   . COLONOSCOPY WITH PROPOFOL N/A 06/29/2015   Procedure: COLONOSCOPY WITH PROPOFOL;  Surgeon: Manya Silvas, MD;  Location: [facility] ENDOSCOPY;  Service: Endoscopy;  Laterality: N/A;  . THYROIDECTOMY  08-2002   [place]    Current Outpatient Medications  Medication Sig Dispense Refill  . FLUoxetine (PROZAC) 40 MG capsule Take 40 mg by mouth.     . levothyroxine (SYNTHROID, LEVOTHROID) 137 MCG tablet Take 137 mcg by mouth daily before breakfast.    .  liothyronine (CYTOMEL) 5 MCG tablet Take by mouth.    . Liraglutide -Weight Management (SAXENDA) 18 MG/3ML SOPN Inject into the skin.     No current facility-administered medications for this visit.     Family History  Problem Relation Age of Onset  . ALS Mother 17       died age 22  . Cancer Mother        Spinal Cord  . Hypertension Father   . Parkinson's disease Maternal Grandmother   . Throat cancer Maternal Grandmother        smoker  . Colon cancer Other 74       maternal uncle  . Lung cancer Maternal Grandfather        smoker  . Breast cancer Other        9--maternal great aunts (maternal grandfather's sisters)    Review of Systems  Constitutional: Negative.   HENT: Negative.   Eyes: Negative.   Respiratory: Negative.   Cardiovascular: Negative.   Gastrointestinal: Negative.   Genitourinary: Negative.   Musculoskeletal: Negative.   Skin: Negative.   Neurological: Negative.   Endo/Heme/Allergies: Negative.   Psychiatric/Behavioral: Negative.     Exam:   BP 116/68 (BP Location: Right Arm, Patient Position: Sitting, Cuff Size: Normal)   Pulse 60   Resp 14  Ht 5' 6.25" (1.683 m)   Wt 190 lb (86.2 kg)   LMP 02/03/2018   BMI 30.44 kg/m     Height: 5' 6.25" (168.3 cm)  Ht Readings from Last 3 Encounters:  02/10/18 5' 6.25" (1.683 m)  04/09/17 5' 6.25" (1.683 m)  02/05/17 5' 6.25" (1.683 m)    General appearance: alert, cooperative and appears stated age Head: Normocephalic, without obvious abnormality, atraumatic Neck: no adenopathy, supple, surgically absent thyroid Lungs: clear to auscultation bilaterally Breasts: normal appearance, no masses or tenderness Heart: regular rate and rhythm Abdomen: soft, non-tender; bowel sounds normal; no masses,  no organomegaly Extremities: extremities normal, atraumatic, no cyanosis or edema Skin: Skin color, texture, turgor normal. No rashes or lesions Lymph nodes: Cervical, supraclavicular, and axillary nodes  normal. No abnormal inguinal nodes palpated Neurologic: Grossly normal  Pelvic: External genitalia:  no lesions              Urethra:  normal appearing urethra with no masses, tenderness or lesions              Bartholins and Skenes: normal                 Vagina: normal appearing vagina with normal color and discharge, no lesions              Cervix: no lesions              Pap taken: Yes.   Bimanual Exam:  Uterus:  normal size, contour, position, consistency, mobility, non-tender              Adnexa: normal adnexa and no mass, fullness, tenderness               Rectovaginal: Confirms               Anus:  normal sphincter tone, no lesions  Chaperone was present for exam.  A:  Well Woman with normal exam Hypothyroidism with hx of prior thyroid cancer Family hx of breast and colon cancer Elevated lipids Mild OSA  P:   Mammogram guidelines reviewed.  Doing yearly. Pap smear and HR HPV obtained today Lab work is UTD Consider decreasing niacin due to flushing Return annually or prn

## 2018-02-12 LAB — CYTOLOGY - PAP
Diagnosis: NEGATIVE
HPV (WINDOPATH): NOT DETECTED

## 2018-02-20 ENCOUNTER — Telehealth: Payer: Self-pay | Admitting: Obstetrics & Gynecology

## 2018-02-20 NOTE — Telephone Encounter (Signed)
Call placed to patient to review benefits for a Mirena IUD insertion. Left voicemail requesting a return call

## 2019-04-28 ENCOUNTER — Encounter

## 2019-04-28 ENCOUNTER — Ambulatory Visit: Payer: 59 | Admitting: Obstetrics & Gynecology

## 2019-04-28 NOTE — Progress Notes (Deleted)
48 y.o. G58P2000 [name] here for annual exam.    No LMP recorded.          Sexually active: {yes no:314532}  The current method of family planning is {contraception:315051}.    Exercising: {yes no:314532}  {types:19826} Smoker:  {YES P5382123  Health Maintenance: Pap:  02/10/18 Neg. HR HPV:neg   02/05/17 neg  History of abnormal Pap:  yes MMG: 06/09/18  BIRADS1:neg - care everywhere  Colonoscopy:  06/29/15 normal. F/u 5 years  BMD:   *** TDaP:  2017 Screening Labs: ***   reports that she has never smoked. She has never used smokeless tobacco. She reports current alcohol use of about 2.0 standard drinks of alcohol per week. She reports that she does not use drugs.  Past Medical History:  Diagnosis Date  . ALLERGIC RHINITIS 07/26/2009  . ELEVATED BLOOD PRESSURE 08/22/2009  . Gastritis   . GERD 08/22/2009  . Hyperlipidemia   . HYPOTHYROIDISM 07/26/2009  . Low iron stores   . 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 OSA (obstructive sleep apnea)   . Ovarian cyst   . 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 COLONOSCOPY  June 2009/ 03-2012   per Dr. Sumner Boast at [facility] , repeat 3 yrs   . COLONOSCOPY WITH PROPOFOL N/A 06/29/2015   Procedure: COLONOSCOPY WITH PROPOFOL;  Surgeon: Manya Silvas, MD;  Location: [facility] ENDOSCOPY;  Service: Endoscopy;  Laterality: N/A;  . THYROIDECTOMY  08-2002   [place]    Current Outpatient Medications  Medication Sig Dispense Refill  . FLUoxetine (PROZAC) 40 MG capsule Take 40 mg by mouth.     . levothyroxine (SYNTHROID, LEVOTHROID) 137 MCG tablet Take 137 mcg by mouth daily before breakfast.    . Liraglutide -Weight Management (SAXENDA) 18 MG/3ML SOPN Inject into the skin.     No current facility-administered medications for this visit.     Family History  Problem Relation Age of Onset  . ALS Mother 49       died age 28  . Cancer Mother         Spinal Cord  . Hypertension Father   . Parkinson's disease Maternal Grandmother   . Throat cancer Maternal Grandmother        smoker  . Colon cancer Other 77       maternal uncle  . Lung cancer Maternal Grandfather        smoker  . Breast cancer Other        9--maternal great aunts (maternal grandfather's sisters)    Review of Systems  Exam:   There were no vitals taken for this visit.  Height:      Ht Readings from Last 3 Encounters:  02/10/18 5' 6.25" (1.683 m)  04/09/17 5' 6.25" (1.683 m)  02/05/17 5' 6.25" (1.683 m)    General appearance: alert, cooperative and appears stated age Head: Normocephalic, without obvious abnormality, atraumatic Neck: no adenopathy, supple, symmetrical, trachea midline and thyroid {EXAM; THYROID:18604} Lungs: clear to auscultation bilaterally Breasts: {Exam; breast:13139::"normal appearance, no masses or tenderness"} Heart: regular rate and rhythm Abdomen: soft, non-tender; bowel sounds normal; no masses,  no organomegaly Extremities: extremities normal, atraumatic, no cyanosis or edema Skin: Skin color, texture, turgor normal. No rashes or lesions Lymph nodes: Cervical, supraclavicular, and axillary nodes normal. No abnormal inguinal nodes palpated Neurologic: Grossly normal   Pelvic: External genitalia:  no lesions  Urethra:  normal appearing urethra with no masses, tenderness or lesions              Bartholins and Skenes: normal                 Vagina: normal appearing vagina with normal color and discharge, no lesions              Cervix: {exam; cervix:14595}              Pap taken: {yes no:314532} Bimanual Exam:  Uterus:  {exam; uterus:12215}              Adnexa: {exam; adnexa:12223}               Rectovaginal: Confirms               Anus:  normal sphincter tone, no lesions  Chaperone was present for exam.  A:  Well Woman with normal exam  P:   {plan; gyn:5269::"mammogram","pap smear","return annually or prn"}

## 2019-05-07 ENCOUNTER — Other Ambulatory Visit: Payer: Self-pay

## 2019-05-08 ENCOUNTER — Telehealth: Payer: Self-pay | Admitting: Obstetrics & Gynecology

## 2019-05-08 NOTE — Telephone Encounter (Signed)
Patient scheduled for AEX 05-11-19 and wants to know if will need pap? She is finishing cycle and not sure if she should cancel appointment. Advised patient to keep appointment.

## 2019-05-08 NOTE — Telephone Encounter (Signed)
Patient is questing if she due to have a pap smear at her next aex 05/11/19? She has started her cycle and may cancel this appointment.

## 2019-05-11 ENCOUNTER — Encounter: Payer: Self-pay | Admitting: Obstetrics & Gynecology

## 2019-05-11 ENCOUNTER — Other Ambulatory Visit: Payer: Self-pay

## 2019-05-11 ENCOUNTER — Other Ambulatory Visit (HOSPITAL_COMMUNITY)
Admission: RE | Admit: 2019-05-11 | Discharge: 2019-05-11 | Disposition: A | Discharge status: Home / Self Care | Payer: 59 | Source: Ambulatory Visit | Attending: Obstetrics & Gynecology | Admitting: Obstetrics & Gynecology

## 2019-05-11 ENCOUNTER — Ambulatory Visit: Payer: 59 | Admitting: Obstetrics & Gynecology

## 2019-05-11 VITALS — BP 120/80 | HR 68 | Temp 97.5°F | Ht 66.25 in | Wt 189.0 lb

## 2019-05-11 DIAGNOSIS — Z01419 Encounter for gynecological examination (general) (routine) without abnormal findings: Secondary | ICD-10-CM

## 2019-05-11 DIAGNOSIS — Z124 Encounter for screening for malignant neoplasm of cervix: Secondary | ICD-10-CM | POA: Diagnosis present

## 2019-05-11 NOTE — Progress Notes (Signed)
48 y.o. G46P2000 [name] here for annual exam.  Cycles are still regular.  Having a little more cramping.  Menstrual flow is still about the same.     Blood work was done in June at [facility].  LDL was 164.    Tyrer Cusick for lifetime risk of breast cancer is 9.6%  Patient's last menstrual period was 05/07/2019 (exact date).          Sexually active: Yes.    The current method of family planning is vasectomy.    Exercising: Yes.    walking Smoker:  no  Health Maintenance: Pap:  02/10/18 Neg. HR HPV:neg   02/05/17 Neg  History of abnormal Pap:  yes MMG:  06/09/18 BIRADS1:neg - Care Everywhere  Colonoscopy:  06/29/15 f/u 5 years  BMD:  Heel test  TDaP:  2017 Screening Labs: Endocrinology    reports that she has never smoked. She has never used smokeless tobacco. She reports current alcohol use of about 2.0 standard drinks of alcohol per week. She reports that she does not use drugs.  Past Medical History:  Diagnosis Date  . ALLERGIC RHINITIS 07/26/2009  . ELEVATED BLOOD PRESSURE 08/22/2009  . Gastritis   . GERD 08/22/2009  . Hyperlipidemia   . HYPOTHYROIDISM 07/26/2009  . Low iron stores   . 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 OSA (obstructive sleep apnea)   . Ovarian cyst   . 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 COLONOSCOPY  June 2009/ 03-2012   per Dr. Sumner Boast at [facility] , repeat 3 yrs   . COLONOSCOPY WITH PROPOFOL N/A 06/29/2015   Procedure: COLONOSCOPY WITH PROPOFOL;  Surgeon: Manya Silvas, MD;  Location: [facility] ENDOSCOPY;  Service: Endoscopy;  Laterality: N/A;  . THYROIDECTOMY  08-2002   [place]    Current Outpatient Medications  Medication Sig Dispense Refill  . buPROPion (WELLBUTRIN XL) 150 MG 24 hr tablet Take one tablet (150 mg dose) by mouth every morning.    Marland Kitchen FLUoxetine (PROZAC) 20 MG capsule Take 1 capsule by mouth daily.    . Levothyroxine Sodium (TIROSINT)  125 MCG CAPS Take 1 capsule by mouth daily.    Marland Kitchen liothyronine (CYTOMEL) 5 MCG tablet Take 1 tablet by mouth daily.    . Vitamin D, Ergocalciferol, (DRISDOL) 1.25 MG (50000 UT) CAPS capsule Take one capsule (50,000 Units dose) by mouth once a week. Once a week for 12 weeks     No current facility-administered medications for this visit.     Family History  Problem Relation Age of Onset  . ALS Mother 14       died age 53  . Cancer Mother        Spinal Cord  . Hypertension Father   . Parkinson's disease Maternal Grandmother   . Throat cancer Maternal Grandmother        smoker  . Colon cancer Other 17       maternal uncle  . Lung cancer Maternal Grandfather        smoker  . Breast cancer Other        9--maternal great aunts (maternal grandfather's sisters)    Review of Systems  All other systems reviewed and are negative.   Exam:   BP 120/80   Pulse 68   Temp (!) 97.5 F (36.4 C) (Temporal)   Ht 5' 6.25" (1.683 m)   Wt 189 lb (85.7 kg)  LMP 05/07/2019 (Exact Date)   BMI 30.28 kg/m  Height: 5' 6.25" (168.3 cm)  Ht Readings from Last 3 Encounters:  05/11/19 5' 6.25" (1.683 m)  02/10/18 5' 6.25" (1.683 m)  04/09/17 5' 6.25" (1.683 m)    General appearance: alert, cooperative and appears stated age Head: Normocephalic, without obvious abnormality, atraumatic Neck: no adenopathy, supple, symmetrical, trachea midline and thyroid normal to inspection and palpation Lungs: clear to auscultation bilaterally Breasts: normal appearance, no masses or tenderness Heart: regular rate and rhythm Abdomen: soft, non-tender; bowel sounds normal; no masses,  no organomegaly Extremities: extremities normal, atraumatic, no cyanosis or edema Skin: Skin color, texture, turgor normal. No rashes or lesions Lymph nodes: Cervical, supraclavicular, and axillary nodes normal. No abnormal inguinal nodes palpated Neurologic: Grossly normal   Pelvic: External genitalia:  no lesions               Urethra:  normal appearing urethra with no masses, tenderness or lesions              Bartholins and Skenes: normal                 Vagina: normal appearing vagina with normal color and discharge, no lesions              Cervix: no lesions              Pap taken: Yes.   Bimanual Exam:  Uterus:  normal size, contour, position, consistency, mobility, non-tender              Adnexa: normal adnexa and no mass, fullness, tenderness               Rectovaginal: Confirms               Anus:  normal sphincter tone, no lesions  Chaperone was present for exam.  A:  Well Woman with normal exam Hypothyroidism with hx of prior thyroid cancer Family hx of breast and colon cancer Elevated lipids Mild OSA  P:   Mammogram guidelines reviewed.  Pt is UTD. pap smear obtained today.  Neg HR HPV 2019. Lab work done in early June at [facility] Colonoscopy screening reviewed.  Due 2021.  Consider BMI after 55 Return annually or prn

## 2019-05-13 LAB — CYTOLOGY - PAP: Diagnosis: NEGATIVE

## 2020-04-04 ENCOUNTER — Encounter: Payer: Self-pay | Admitting: Obstetrics & Gynecology

## 2020-04-04 ENCOUNTER — Telehealth: Payer: Self-pay | Admitting: *Deleted

## 2020-04-04 NOTE — Telephone Encounter (Signed)
[name] [name]  Phone Number: [phone]  Hi Dr Sabra Heck,  I'm scheduled for my annual in September but want to ask a menopause question that's popped up now. I turned 49 May 10 and until then have been pretty consistently a 28-30 day cycle for years and years. Suddenly, my cycle was due to start 10 days ago Saturday May29 and no sign. For 2 weeks I've had all the symptoms- achy lower back, achey heavy knees...spurts of moodiness...sporadic crampiness. For months I've also had hot flashes but below the waist- waking up with dripping wet legs that I throw off the covers and they cool down. Strange.   Long story long, but is this all middle age transitioning or anything alarming?   Thanks, Nichole Flores

## 2020-04-04 NOTE — Telephone Encounter (Signed)
Spoke with pt. Pt states having vasomotor sx of night sweats, low back cramps/pain that are described as mild, mood swings, and hot flashes occasionally. Pt states last regular monthly cycle was in April. States was suppose to start on 5/29 and no cycle, but had PMS sx for last 2 weeks. Pt states taking OTC ibuprofen as needed for cramps and is resolved. Denies fever, chills, UTI sx at this time.   Advised pt to have OV to discuss before AEX, pt declines. Pt not wanting any HRT at this time. Pt states they are bearable for now and just wanted to update Dr Sabra Heck. Pt agreeable to keep AEX scheduled in 06/2020 with Dr Sabra Heck and will calender cycles from now to then.   Pt states has lost 15 lbs since beginning of year and walks 1 hour a day for exercise and is still taking Wellbutrin and Prozac Rx as prescribed.   Advised will return call if any additional recommendations. Pt agreeable.  Routing to Dr Sabra Heck for review.  Encounter closed.

## 2020-04-11 ENCOUNTER — Encounter: Payer: Self-pay | Admitting: Obstetrics & Gynecology

## 2020-04-12 ENCOUNTER — Telehealth: Payer: Self-pay

## 2020-04-12 NOTE — Telephone Encounter (Signed)
Nichole Flores A  P [facility] Hi again! Still no menstrual cycle- 17 days late. I've been consistently crampy,lots of pelvic achiness, breast tenderness. I feel like it should be here but still nothing. Can I get an ultrasound to check for ovarian cysts? It's been a few years since I've had one but remember similar cramping and achiness. Thanks, Nichole Flores

## 2020-04-12 NOTE — Telephone Encounter (Signed)
AEX 07/05/20  Spoke with pt. Pt states having irregular cycles that are coming late. Pt sent Mychart message last night and then started cycle. Pt describes as dark brown and light. Pt only wearing panty liner. Pt states last LMP 02/27/20 and this current cycle was 17 days late. Pt thinks she is going into menopause. Had regular cycles till now. Pt states having breast tenderness, cramps and pelvic achiness x 2 weeks before this current cycle started.  Pt requesting a mychart visit with Dr Sabra Heck to discuss possible labs and plan of care. Pt scheduled for 6/24 at 4:30pm.  Pt has next AEX on 07/05/20. Pt requesting video visit due to living in [place], [place] now. Advised will update and review with Dr Sabra Heck and return call if any additional recommendations. Pt agreeable.   Routing to Dr Sabra Heck for review.  Encounter closed.

## 2020-04-21 ENCOUNTER — Telehealth (INDEPENDENT_AMBULATORY_CARE_PROVIDER_SITE_OTHER): Payer: BC Managed Care – PPO | Admitting: Obstetrics & Gynecology

## 2020-04-21 ENCOUNTER — Encounter: Payer: Self-pay | Admitting: Obstetrics & Gynecology

## 2020-04-21 ENCOUNTER — Other Ambulatory Visit: Payer: Self-pay

## 2020-04-21 DIAGNOSIS — N951 Menopausal and female climacteric states: Secondary | ICD-10-CM

## 2020-04-21 NOTE — Progress Notes (Signed)
Virtual Visit via Video Note  I connected with Nichole Flores on 04/21/20 at  4:30 PM EDT by a video enabled telemedicine application and verified that I am speaking with the correct person using two identifiers.  Location: Patient: home Provider: office   I discussed the limitations of evaluation and management by telemedicine and the availability of in person appointments. The patient expressed understanding and agreed to proceed.  History of Present Illness: 49 yo G2P2 MWF who lives in [place].  She is having irregular cycles and is feeling peri-menopausal and wants to discuss cycle changes and other symptoms.  Cycles have been typically regular up until this cycle change in June.  Cycle started 17 days late.  Flow was heavier and lasted 7 days.  This was an atypical cycle for her with how long it lasted.  She is seeing endocrinology and hematology at [facility], respectively.  Had recent iron infusion for low ferritin.  Doesn't have any other source of bleeding other than cycles.  Other than last once, cycles are not particularly heavy in her opinion.  Wants my thoughts about this.  We discussed possible causes of low ferritin.  Hematologist feels this will resolve with menopause but will take a few years of follow up ferritin levels to know this for sure.   Observations/Objective: WNWD WF, NAD  Assessment and Plan: Perimenopausal menstrual bleeding H/O low ferritin, s/p iron infusion  Follow Up Instructions: If continues to have irregular cycles, will check either FSH or AMH levels.  Pt will be sent order to have done if this continues.  D/w pt provera challenge if no cycles for >90 days (or earlier if desired to prevent heavy menstrual cycle).  She is going to be sent an order in the mail.  Can have blood work done at [facility] lab.     I discussed the assessment and treatment plan with the patient. The patient was provided an opportunity to ask questions and all were answered. The  patient agreed with the plan and demonstrated an understanding of the instructions.   The patient was advised to call back or seek an in-person evaluation if the symptoms worsen or if the condition fails to improve as anticipated.  I provided 25 minutes of non-face-to-face time during this encounter.   [name], MD

## 2020-06-30 NOTE — Progress Notes (Signed)
49 y.o. G63P2000 [name] here for annual exam.  Doing well.    Changing jobs.  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    Had another iron infusion in April.  Being followed by hematology.  Hematologist thinks she will always have this issue until her cycles have stopped.  Cycles are still regular.  Cholesterol is elevated.  Followed by endocrinologist.  Statin has been recommended.  Pt really does not want to take this.  Concered about possible ALS link.  Mother died of ALS.  Pt states " I would rather die of a stroke".  Has never seen cardiology.  Feels like having same discussion with endocrinology again and again.  Feels additional input/guidelines/recommendatison would be helpful.  No chest pain/SOB.    Would like B12 and Vit D level today.  LMP:  07/04/2020 Sexually active: Yes.    The current method of family planning is vasectomy.    Exercising: Yes.    walking Smoker:  no  Health Maintenance: Pap:  02-05-17 neg, 02-10-18 neg HPV HR neg, 05-11-2019 neg History of abnormal Pap:  yes MMG:  06-13-2020 category b density birads 1:neg Colonoscopy:  11/2019 f/u in 19yr due to poor prep BMD:   Heel test TDaP:  2017 Pneumonia vaccine(s):  Not done Shingrix:   Not done Hep C testing: not done Screening Labs: iron level will be obtained today   reports that she has never smoked. She has never used smokeless tobacco. She reports current alcohol use of about 2.0 standard drinks of alcohol per week. She reports that she does not use drugs.  Past Medical History:  Diagnosis Date  . ALLERGIC RHINITIS 07/26/2009  . ELEVATED BLOOD PRESSURE 08/22/2009  . Gastritis   . GERD 08/22/2009  . Hyperlipidemia   . HYPOTHYROIDISM 07/26/2009  . Low iron stores   . 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 OSA (obstructive sleep apnea)   . Ovarian cyst   . 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 Shingles     Past Surgical History:  Procedure Laterality Date   . COLONOSCOPY  June 2009/ 03-2012   per Dr. Sumner Boast at [facility] , repeat 3 yrs   . COLONOSCOPY WITH PROPOFOL N/A 06/29/2015   Procedure: COLONOSCOPY WITH PROPOFOL;  Surgeon: Manya Silvas, MD;  Location: [facility] ENDOSCOPY;  Service: Endoscopy;  Laterality: N/A;  . THYROIDECTOMY  08-2002   [place]    Current Outpatient Medications  Medication Sig Dispense Refill  . buPROPion (WELLBUTRIN XL) 300 MG 24 hr tablet Take by mouth.    Marland Kitchen FLUoxetine (PROZAC) 10 MG capsule Take by mouth.    . Levothyroxine Sodium (TIROSINT) 125 MCG CAPS Take by mouth.    . liothyronine (CYTOMEL) 5 MCG tablet Take 5 mcg by mouth daily.     No current facility-administered medications for this visit.    Family History  Problem Relation Age of Onset  . ALS Mother 53       died age 75  . Cancer Mother        Spinal Cord  . Hypertension Father   . Parkinson's disease Maternal Grandmother   . Throat cancer Maternal Grandmother        smoker  . Colon cancer Other 7       maternal uncle  . Lung cancer Maternal Grandfather        smoker  . Breast cancer Other        9--maternal great aunts (  maternal grandfather's sisters)    Review of Systems  Constitutional: Negative.   HENT: Negative.   Eyes: Negative.   Respiratory: Negative.   Cardiovascular: Negative.   Gastrointestinal: Negative.   Endocrine: Negative.   Genitourinary: Negative.   Musculoskeletal: Negative.   Skin: Negative.   Allergic/Immunologic: Negative.   Neurological: Negative.   Hematological: Negative.   Psychiatric/Behavioral: Negative.     Exam:   BP 108/62   Pulse 70   Resp 16   Ht 5' 6.25" (1.683 m)   Wt 177 lb (80.3 kg)   LMP 07/04/2020 (Exact Date)   BMI 28.35 kg/m   Height: 5' 6.25" (168.3 cm)  General appearance: alert, cooperative and appears stated age Head: Normocephalic, without obvious abnormality, atraumatic Neck: no adenopathy, supple, symmetrical, trachea midline and thyroid normal to  inspection and palpation Lungs: clear to auscultation bilaterally Breasts: normal appearance, no masses or tenderness Heart: regular rate and rhythm Abdomen: soft, non-tender; bowel sounds normal; no masses,  no organomegaly Extremities: extremities normal, atraumatic, no cyanosis or edema Skin: Skin color, texture, turgor normal. No rashes or lesions Lymph nodes: Cervical, supraclavicular, and axillary nodes normal. No abnormal inguinal nodes palpated Neurologic: Grossly normal   Pelvic: External genitalia:  no lesions              Urethra:  normal appearing urethra with no masses, tenderness or lesions              Bartholins and Skenes: normal                 Vagina: normal appearing vagina with normal color and discharge, no lesions              Cervix: no lesions              Pap taken: No. Bimanual Exam:  Uterus:  normal size, contour, position, consistency, mobility, non-tender              Adnexa: normal adnexa and no mass, fullness, tenderness               Rectovaginal: Confirms               Anus:  normal sphincter tone, no lesions  Chaperone, Olene Floss, CMA, was present for exam.  A:  Well Woman with normal exam Hypothyroidism with hx of prior thyroid cancer Family hx of breast and colon cancer Elevated lipids, does not want to be on statin  P:   Mammogram guidelines reviewed.  Doing yearly MMG pap smear neg 2020 neg HR HPV 2021 Colonoscopy due 11/2020 due to poor prep We have discussed BMD after 55 B12, iron panel, Vit D Referral to cardiology at [facility] placed today  Return annually or prn

## 2020-07-05 ENCOUNTER — Encounter: Payer: Self-pay | Admitting: Obstetrics & Gynecology

## 2020-07-05 ENCOUNTER — Ambulatory Visit (INDEPENDENT_AMBULATORY_CARE_PROVIDER_SITE_OTHER): Payer: BC Managed Care – PPO | Admitting: Obstetrics & Gynecology

## 2020-07-05 ENCOUNTER — Other Ambulatory Visit: Payer: Self-pay

## 2020-07-05 VITALS — BP 108/62 | HR 70 | Resp 16 | Ht 66.25 in | Wt 177.0 lb

## 2020-07-05 DIAGNOSIS — R790 Abnormal level of blood mineral: Secondary | ICD-10-CM

## 2020-07-05 DIAGNOSIS — E7800 Pure hypercholesterolemia, unspecified: Secondary | ICD-10-CM | POA: Diagnosis not present

## 2020-07-05 DIAGNOSIS — Z01419 Encounter for gynecological examination (general) (routine) without abnormal findings: Secondary | ICD-10-CM | POA: Diagnosis not present

## 2020-07-06 LAB — IRON,TIBC AND FERRITIN PANEL
Ferritin: 76 ng/mL (ref 15–150)
Iron Saturation: 23 % (ref 15–55)
Iron: 69 ug/dL (ref 27–159)
Total Iron Binding Capacity: 299 ug/dL (ref 250–450)
UIBC: 230 ug/dL (ref 131–425)

## 2020-07-06 LAB — VITAMIN B12: Vitamin B-12: 615 pg/mL (ref 232–1245)

## 2020-07-06 LAB — VITAMIN D 25 HYDROXY (VIT D DEFICIENCY, FRACTURES): Vit D, 25-Hydroxy: 40.9 ng/mL (ref 30.0–100.0)

## 2020-07-19 ENCOUNTER — Telehealth: Payer: Self-pay | Admitting: Obstetrics & Gynecology

## 2020-07-19 NOTE — Telephone Encounter (Signed)
Left voicemail regarding referral appointment. The information is listed below. Should the patient need to cancel or reschedule this appointment, Please advise them to call the office they've been referred to in order to reschedule.  [facility] Cardiology at [facility]) [address] [facility] [place], [place]  [address] [phone]  Dr. Delfina Redwood 08/30/2020 @ 1:30 pm. Please arrive 15 minutes early and bring your insurance card and photo id and list of medications.

## 2020-07-28 NOTE — Telephone Encounter (Signed)
Call placed to convey Cardiology referral information.

## 2020-11-09 ENCOUNTER — Telehealth: Payer: Self-pay | Admitting: Obstetrics & Gynecology

## 2020-11-10 ENCOUNTER — Telehealth: Payer: Self-pay | Admitting: Obstetrics & Gynecology

## 2020-11-10 NOTE — Telephone Encounter (Signed)
Called pt in response to [employer].  Pt reported some ovarian cramping about 3 weeks ago.  Had pelvic MRI showing 6.7RF ovarian follicle.  This was done at [facility].  Pt sent message about this requestion recommendations.  reveiwed MRI through care everywhere.  This appears to be a physiologic follicle and does not need any dedicated follow up.  Additional results in MRI I will defer to ordering physician to discuss/review.  Called pt personally.  No answer.  Reviewed DPR and left detailed message.

## 2020-11-10 NOTE — Telephone Encounter (Signed)
I called her today and sent her a mychart message as well after reviewing her MRI.  Thanks for the message.

## 2021-07-20 ENCOUNTER — Telehealth (HOSPITAL_BASED_OUTPATIENT_CLINIC_OR_DEPARTMENT_OTHER): Payer: Self-pay

## 2021-07-20 ENCOUNTER — Encounter (HOSPITAL_BASED_OUTPATIENT_CLINIC_OR_DEPARTMENT_OTHER): Payer: Self-pay | Admitting: Obstetrics & Gynecology

## 2021-07-20 ENCOUNTER — Ambulatory Visit: Payer: BC Managed Care – PPO | Attending: Internal Medicine

## 2021-07-20 ENCOUNTER — Ambulatory Visit (INDEPENDENT_AMBULATORY_CARE_PROVIDER_SITE_OTHER): Payer: BC Managed Care – PPO | Admitting: Obstetrics & Gynecology

## 2021-07-20 ENCOUNTER — Other Ambulatory Visit (HOSPITAL_COMMUNITY)
Admission: RE | Admit: 2021-07-20 | Discharge: 2021-07-20 | Disposition: A | Discharge status: Home / Self Care | Payer: BC Managed Care – PPO | Source: Ambulatory Visit | Attending: Obstetrics & Gynecology | Admitting: Obstetrics & Gynecology

## 2021-07-20 ENCOUNTER — Other Ambulatory Visit (HOSPITAL_BASED_OUTPATIENT_CLINIC_OR_DEPARTMENT_OTHER): Payer: Self-pay

## 2021-07-20 ENCOUNTER — Other Ambulatory Visit: Payer: Self-pay

## 2021-07-20 VITALS — BP 110/81 | HR 61 | Ht 66.5 in | Wt 175.2 lb

## 2021-07-20 DIAGNOSIS — Z800 Family history of malignant neoplasm of digestive organs: Secondary | ICD-10-CM

## 2021-07-20 DIAGNOSIS — Z124 Encounter for screening for malignant neoplasm of cervix: Secondary | ICD-10-CM

## 2021-07-20 DIAGNOSIS — Z803 Family history of malignant neoplasm of breast: Secondary | ICD-10-CM | POA: Diagnosis not present

## 2021-07-20 DIAGNOSIS — Z01419 Encounter for gynecological examination (general) (routine) without abnormal findings: Secondary | ICD-10-CM | POA: Diagnosis not present

## 2021-07-20 DIAGNOSIS — E890 Postprocedural hypothyroidism: Secondary | ICD-10-CM

## 2021-07-20 DIAGNOSIS — Z23 Encounter for immunization: Secondary | ICD-10-CM

## 2021-07-20 MED ORDER — NORETHINDRONE 0.35 MG PO TABS: 1.0000 | ORAL_TABLET | Freq: Every day | ORAL | 3 refills | Status: DC

## 2021-07-20 NOTE — Telephone Encounter (Signed)
Entered in Error

## 2021-07-20 NOTE — Progress Notes (Signed)
50 y.o. G41P2000 [name] here for annual exam.  Continues to have issues with ferritin deficiency.  She is followed by hematology and received multiple iron infusion.  She has considered a progesterone IUD and is not interested in this.  Wants to consider other options.  Is a little scared about estrogen risks.  She and I discussed POPs.  Had MRI of LE 11/05/2020 and this showed a 1.9cm right cystic ovarian.    Cycles are still reagulr and last 6 days.   Sexually active: Yes.    The current method of family planning is vasectomy.    Exercising: Yes.     Pilates 4 days a week, walking her dog Smoker:  no  Health Maintenance: Pap:  05/11/2019 Negative History of abnormal Pap:  no MMG:  05/2021 at [facility] Colonoscopy:  06/29/2015 TDaP:  2016 Screening Labs: labs reviewed in [facility] system from 2022   reports that she has never smoked. She has never used smokeless tobacco. She reports current alcohol use of about 2.0 standard drinks per week. She reports that she does not use drugs.  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 Sumner Boast at [facility] , repeat 3 yrs    COLONOSCOPY WITH PROPOFOL N/A 06/29/2015   Procedure: COLONOSCOPY WITH PROPOFOL;  Surgeon: Manya Silvas, MD;  Location: [place];  Service: Endoscopy;  Laterality: N/A;   THYROIDECTOMY  08/29/2002   [place]    Current Outpatient Medications  Medication Sig Dispense Refill   buPROPion (WELLBUTRIN XL) 300 MG 24 hr tablet Take by mouth.     liothyronine  (CYTOMEL) 5 MCG tablet Take 5 mcg by mouth daily.     norethindrone (MICRONOR) 0.35 MG tablet Take 1 tablet (0.35 mg total) by mouth daily. 84 tablet 3   FLUoxetine (PROZAC) 10 MG capsule Take by mouth.     Levothyroxine Sodium (TIROSINT) 125 MCG CAPS Take by mouth.     No current facility-administered medications for this visit.    Family History  Problem Relation Age of Onset   ALS Mother 46       died age 67   Cancer Mother        Spinal Cord   Hypertension Father    Parkinson's disease Maternal Grandmother    Throat cancer Maternal Grandmother        smoker   Colon cancer Other 42       maternal uncle   Lung cancer Maternal Grandfather        smoker   Breast cancer Other        9--maternal great aunts (maternal grandfather's sisters)    Review of Systems  All other systems reviewed and are negative.  Exam:   BP 110/81 (BP Location: Left Arm, Patient Position: Sitting, Cuff Size: Small)   Pulse 61   Ht 5' 6.5" (1.689 m)   Wt 175 lb 3.2 oz (79.5 kg)   LMP 07/12/2021   BMI 27.85 kg/m  Height: 5' 6.5" (168.9 cm)  General appearance: alert, cooperative and appears stated age Head: Normocephalic, without obvious abnormality, atraumatic Neck: no adenopathy, supple, symmetrical, trachea midline and thyroid normal to inspection and palpation Lungs: clear to auscultation bilaterally Breasts: normal appearance, no masses or tenderness Heart: regular rate and rhythm Abdomen: soft, non-tender; bowel sounds normal; no masses,  no organomegaly Extremities: extremities normal, atraumatic, no cyanosis or edema Skin: Skin color, texture, turgor normal. No rashes or lesions Lymph nodes: Cervical, supraclavicular, and axillary nodes normal. No abnormal inguinal nodes palpated Neurologic: Grossly normal   Pelvic: External genitalia:  no lesions              Urethra:  normal appearing urethra with no masses, tenderness or lesions              Bartholins and Skenes: normal                  Vagina: normal appearing vagina with normal color and no discharge, no lesions              Cervix: no lesions              Pap taken: Yes.   Bimanual Exam:  Uterus:  normal size, contour, position, consistency, mobility, non-tender              Adnexa: normal adnexa and no mass, fullness, tenderness               Rectovaginal: Confirms               Anus:  normal sphincter tone, no lesions  Chaperone, Octaviano Batty, CMA, was present for exam.  Assessment/Plan: 1. Well woman exam with routine gynecological exam - pap obtained today.  Pt and I discussed guidelines.  Had negative pap 04/2019 with neg HR HPV - MMG 05/2021 - colonoscopy 05/2015, has follow up scheduled in November - lab work reviewed in [place] reviewed/updated  2. Family history of malignant neoplasm of breast  3. Family hx of colon cancer  4. 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  Iron deficiency, followed by hematology - we have discussed treatment options in the past including progesterone and progesterone IUD.  She is ready to try micronor.  Side effects discussed.  Rx to pharmacy.  She will give update in 3 months.

## 2021-07-21 ENCOUNTER — Other Ambulatory Visit (HOSPITAL_BASED_OUTPATIENT_CLINIC_OR_DEPARTMENT_OTHER): Payer: Self-pay

## 2021-07-21 MED ORDER — PFIZER COVID-19 VAC BIVALENT 30 MCG/0.3ML IM SUSP
INTRAMUSCULAR | 0 refills | Status: AC
  Filled 2021-07-21: qty 0.3, 1d supply, fill #0

## 2021-07-21 NOTE — Progress Notes (Signed)
   Covid-19 Vaccination Clinic  Name:  Nichole Flores    MRN: [id number] DOB: June 10, 1971  07/21/2021  [name] was observed post Covid-19 immunization for 15 minutes without incident. She was provided with Vaccine Information Sheet and instruction to access the V-Safe system.   [name] was instructed to call 911 with any severe reactions post vaccine: Difficulty breathing  Swelling of face and throat  A fast heartbeat  A bad rash all over body  Dizziness and weakness

## 2021-07-23 ENCOUNTER — Encounter (HOSPITAL_BASED_OUTPATIENT_CLINIC_OR_DEPARTMENT_OTHER): Payer: Self-pay | Admitting: Obstetrics & Gynecology

## 2021-07-24 LAB — CYTOLOGY - PAP: Diagnosis: NEGATIVE

## 2022-06-11 ENCOUNTER — Other Ambulatory Visit (HOSPITAL_BASED_OUTPATIENT_CLINIC_OR_DEPARTMENT_OTHER): Payer: Self-pay | Admitting: Obstetrics & Gynecology

## 2022-07-27 ENCOUNTER — Other Ambulatory Visit (HOSPITAL_COMMUNITY)
Admission: RE | Admit: 2022-07-27 | Discharge: 2022-07-27 | Disposition: A | Discharge status: Home / Self Care | Payer: BC Managed Care – PPO | Source: Ambulatory Visit | Attending: Obstetrics & Gynecology | Admitting: Obstetrics & Gynecology

## 2022-07-27 ENCOUNTER — Encounter (HOSPITAL_BASED_OUTPATIENT_CLINIC_OR_DEPARTMENT_OTHER): Payer: Self-pay | Admitting: Obstetrics & Gynecology

## 2022-07-27 ENCOUNTER — Ambulatory Visit (INDEPENDENT_AMBULATORY_CARE_PROVIDER_SITE_OTHER): Payer: BC Managed Care – PPO | Admitting: Obstetrics & Gynecology

## 2022-07-27 VITALS — BP 110/74 | HR 59 | Ht 66.0 in | Wt 180.0 lb

## 2022-07-27 DIAGNOSIS — Z01419 Encounter for gynecological examination (general) (routine) without abnormal findings: Secondary | ICD-10-CM

## 2022-07-27 DIAGNOSIS — Z23 Encounter for immunization: Secondary | ICD-10-CM

## 2022-07-27 DIAGNOSIS — Z124 Encounter for screening for malignant neoplasm of cervix: Secondary | ICD-10-CM

## 2022-07-27 DIAGNOSIS — Z803 Family history of malignant neoplasm of breast: Secondary | ICD-10-CM | POA: Diagnosis not present

## 2022-07-27 DIAGNOSIS — Z0000 Encounter for general adult medical examination without abnormal findings: Secondary | ICD-10-CM | POA: Diagnosis not present

## 2022-07-27 DIAGNOSIS — E890 Postprocedural hypothyroidism: Secondary | ICD-10-CM

## 2022-07-27 DIAGNOSIS — N912 Amenorrhea, unspecified: Secondary | ICD-10-CM

## 2022-07-27 DIAGNOSIS — R790 Abnormal level of blood mineral: Secondary | ICD-10-CM

## 2022-07-27 MED ORDER — NORETHINDRONE 0.35 MG PO TABS: 1.0000 | ORAL_TABLET | Freq: Every day | ORAL | 3 refills | Status: DC

## 2022-07-27 NOTE — Progress Notes (Signed)
51 y.o. G19P2000 [name] here for annual exam.  Has not had a cycle since last year.  On POP.    Did take [facility] for 4 months but didn't lose any weight.  Tolerated the medication well.    No LMP recorded.          Sexually active: Yes.    The current method of family planning is oral progesterone-only contraceptive.    Smoker:  no  Health Maintenance: Pap:  07/20/2021 Normal History of abnormal Pap:  no MMG:  06/26/2022 Normal Colonoscopy:  09/08/2021 Screening Labs: ordered today   reports that she has never smoked. She has never used smokeless tobacco. She reports current alcohol use of about 2.0 standard drinks of alcohol per week. She reports that she does not use drugs.  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 Sumner Boast at [facility] , repeat 3 yrs    COLONOSCOPY WITH PROPOFOL N/A 06/29/2015   Procedure: COLONOSCOPY WITH PROPOFOL;  Surgeon: Manya Silvas, MD;  Location: [place];  Service: Endoscopy;  Laterality: N/A;   THYROIDECTOMY  08/29/2002   [place]    Current Outpatient Medications  Medication Sig Dispense Refill   buPROPion (WELLBUTRIN XL) 300 MG 24 hr tablet Take by mouth.     COVID-19 mRNA bivalent vaccine, Pfizer, (PFIZER COVID-19 VAC BIVALENT) injection Inject into the muscle. 0.3 mL 0   FLUoxetine (PROZAC) 20 MG tablet Take 20 mg by mouth daily.     levothyroxine (SYNTHROID) 150 MCG tablet Take 150 mcg by mouth daily before breakfast. Take 5 days a week     levothyroxine (SYNTHROID)  50 MCG tablet Take 50 mcg by mouth daily before breakfast. Take 2 days a week     norethindrone (HEATHER) 0.35 MG tablet Take 1 tablet (0.35 mg total) by mouth daily. 84 tablet 3   No current facility-administered medications for this visit.    Family History  Problem Relation Age of Onset   ALS Mother 44       died age 37   Cancer Mother        Spinal Cord   Hypertension Father    Parkinson's disease Maternal Grandmother    Throat cancer Maternal Grandmother        smoker   Colon cancer Other 51       maternal uncle   Lung cancer Maternal Grandfather        smoker   Breast cancer Other        9--maternal great aunts (maternal grandfather's sisters)    ROS: Constitutional: negative Genitourinary:negative  Exam:   BP 110/74 (BP Location: Right Arm, Patient Position: Sitting, Cuff Size: Large)   Pulse (!) 59   Ht '5\' 6"'$  (1.676 m) Comment: Reported  Wt 180 lb (81.6 kg)   BMI 29.05 kg/m   Height: '5\' 6"'$  (167.6 cm) (Reported)  General appearance: alert, cooperative and appears stated age Head: Normocephalic, without obvious abnormality, atraumatic Neck: no adenopathy,  supple, symmetrical, trachea midline and thyroid normal to inspection and palpation Lungs: clear to auscultation bilaterally Breasts: normal appearance, no masses or tenderness Heart: regular rate and rhythm Abdomen: soft, non-tender; bowel sounds normal; no masses,  no organomegaly Extremities: extremities normal, atraumatic, no cyanosis or edema Skin: Skin color, texture, turgor normal. No rashes or lesions Lymph nodes: Cervical, supraclavicular, and axillary nodes normal. No abnormal inguinal nodes palpated Neurologic: Grossly normal   Pelvic: External genitalia:  no lesions              Urethra:  normal appearing urethra with no masses, tenderness or lesions              Bartholins and Skenes: normal                 Vagina: normal appearing vagina with normal color and no discharge, no lesions               Cervix: no lesions              Pap taken: No. Bimanual Exam:  Uterus:  normal size, contour, position, consistency, mobility, non-tender              Adnexa: normal adnexa and no mass, fullness, tenderness               Rectovaginal: Confirms               Anus:  normal sphincter tone, no lesions  Chaperone, Octaviano Batty, CMA, was present for exam.  Assessment/Plan: 1. Well woman exam with routine gynecological exam - Pap smear and HR HPV obtained - Mammogram done this year - Colonoscopy 08/2021 - Bone mineral density not indicated - lab work done done with PCP - vaccines reviewed/updated.  Flu vaccine given today.  2. Amenorrhea - Follicle stimulating hormone - norethindrone (HEATHER) 0.35 MG tablet; Take 1 tablet (0.35 mg total) by mouth daily.  Dispense: 84 tablet; Refill: 3 - Hemoglobin A1c  3. Postoperative hypothyroidism - Thyroid Panel With TSH  4. Blood tests for routine general physical examination - Comprehensive metabolic panel - CBC  5. Low ferritin - Ferritin  6. Cervical cancer screening - Cytology - PAP( [facility]o)  7. Family history of malignant neoplasm of breast

## 2022-07-28 LAB — CBC
Hematocrit: 43 % (ref 34.0–46.6)
Hemoglobin: 14.3 g/dL (ref 11.1–15.9)
MCH: 29.9 pg (ref 26.6–33.0)
MCHC: 33.3 g/dL (ref 31.5–35.7)
MCV: 90 fL (ref 79–97)
Platelets: 378 10*3/uL (ref 150–450)
RBC: 4.79 x10E6/uL (ref 3.77–5.28)
RDW: 12.7 % (ref 11.7–15.4)
WBC: 5.9 10*3/uL (ref 3.4–10.8)

## 2022-07-28 LAB — HEMOGLOBIN A1C
Est. average glucose Bld gHb Est-mCnc: 108 mg/dL
Hgb A1c MFr Bld: 5.4 % (ref 4.8–5.6)

## 2022-07-28 LAB — FOLLICLE STIMULATING HORMONE: FSH: 9.8 m[IU]/mL

## 2022-07-28 LAB — THYROID PANEL WITH TSH
Free Thyroxine Index: 1.7 (ref 1.2–4.9)
T3 Uptake Ratio: 24 % (ref 24–39)
T4, Total: 7 ug/dL (ref 4.5–12.0)
TSH: 3.51 u[IU]/mL (ref 0.450–4.500)

## 2022-07-28 LAB — COMPREHENSIVE METABOLIC PANEL
ALT: 25 IU/L (ref 0–32)
AST: 23 IU/L (ref 0–40)
Albumin/Globulin Ratio: 2 (ref 1.2–2.2)
Albumin: 4.9 g/dL (ref 3.8–4.9)
Alkaline Phosphatase: 52 IU/L (ref 44–121)
BUN/Creatinine Ratio: 18 (ref 9–23)
BUN: 15 mg/dL (ref 6–24)
Bilirubin Total: 0.5 mg/dL (ref 0.0–1.2)
CO2: 24 mmol/L (ref 20–29)
Calcium: 9.5 mg/dL (ref 8.7–10.2)
Chloride: 98 mmol/L (ref 96–106)
Creatinine, Ser: 0.82 mg/dL (ref 0.57–1.00)
Globulin, Total: 2.4 g/dL (ref 1.5–4.5)
Glucose: 87 mg/dL (ref 70–99)
Potassium: 3.9 mmol/L (ref 3.5–5.2)
Sodium: 139 mmol/L (ref 134–144)
Total Protein: 7.3 g/dL (ref 6.0–8.5)
eGFR: 86 mL/min/{1.73_m2} (ref >59)

## 2022-07-28 LAB — FERRITIN: Ferritin: 114 ng/mL (ref 15–150)

## 2022-07-31 LAB — CYTOLOGY - PAP
Comment: NEGATIVE
Diagnosis: NEGATIVE
High risk HPV: NEGATIVE

## 2022-09-03 ENCOUNTER — Other Ambulatory Visit (HOSPITAL_BASED_OUTPATIENT_CLINIC_OR_DEPARTMENT_OTHER): Payer: Self-pay | Admitting: Obstetrics & Gynecology

## 2022-09-03 ENCOUNTER — Encounter (HOSPITAL_BASED_OUTPATIENT_CLINIC_OR_DEPARTMENT_OTHER): Payer: Self-pay | Admitting: Obstetrics & Gynecology

## 2022-09-03 DIAGNOSIS — R102 Pelvic and perineal pain: Secondary | ICD-10-CM

## 2022-09-03 DIAGNOSIS — R1031 Right lower quadrant pain: Secondary | ICD-10-CM

## 2022-09-03 DIAGNOSIS — Z8742 Personal history of other diseases of the female genital tract: Secondary | ICD-10-CM

## 2022-09-18 ENCOUNTER — Ambulatory Visit (HOSPITAL_BASED_OUTPATIENT_CLINIC_OR_DEPARTMENT_OTHER)
Admission: RE | Admit: 2022-09-18 | Discharge: 2022-09-18 | Disposition: A | Discharge status: Home / Self Care | Payer: BC Managed Care – PPO | Source: Ambulatory Visit | Attending: Obstetrics & Gynecology | Admitting: Obstetrics & Gynecology

## 2022-09-18 DIAGNOSIS — R102 Pelvic and perineal pain: Secondary | ICD-10-CM | POA: Diagnosis not present

## 2022-09-18 DIAGNOSIS — R1031 Right lower quadrant pain: Secondary | ICD-10-CM | POA: Diagnosis present

## 2022-09-18 DIAGNOSIS — Z8742 Personal history of other diseases of the female genital tract: Secondary | ICD-10-CM | POA: Insufficient documentation

## 2023-02-22 ENCOUNTER — Encounter (HOSPITAL_BASED_OUTPATIENT_CLINIC_OR_DEPARTMENT_OTHER): Payer: Self-pay | Admitting: Obstetrics & Gynecology

## 2023-02-24 ENCOUNTER — Other Ambulatory Visit (HOSPITAL_BASED_OUTPATIENT_CLINIC_OR_DEPARTMENT_OTHER): Payer: Self-pay | Admitting: Obstetrics & Gynecology

## 2023-02-24 DIAGNOSIS — N751 Abscess of Bartholin's gland: Secondary | ICD-10-CM

## 2023-02-24 MED ORDER — CEPHALEXIN 500 MG PO CAPS
500.0000 mg | ORAL_CAPSULE | Freq: Four times a day (QID) | ORAL | 0 refills | Status: DC
Start: 2023-02-24 — End: 2023-11-13

## 2023-02-24 MED ORDER — FLUCONAZOLE 150 MG PO TABS
150.0000 mg | ORAL_TABLET | Freq: Once | ORAL | 0 refills | Status: AC
Start: 2023-02-24 — End: 2023-02-24

## 2023-02-24 NOTE — Telephone Encounter (Signed)
I sent in a prescription for Keflex.  Take with food.  I also sent in a Diflucan (anti-fungal) just in case you get yeast with the antibiotics.    I'll have [name], our RN, call you and get you scheduled for when you get back.  Hope your [name] is doing well!  [name]

## 2023-02-25 NOTE — Telephone Encounter (Signed)
Called pt and provided with appt to be evaluated by provider on 02/28/23.

## 2023-02-28 ENCOUNTER — Encounter (HOSPITAL_BASED_OUTPATIENT_CLINIC_OR_DEPARTMENT_OTHER): Payer: Self-pay | Admitting: Obstetrics & Gynecology

## 2023-02-28 ENCOUNTER — Ambulatory Visit (INDEPENDENT_AMBULATORY_CARE_PROVIDER_SITE_OTHER): Payer: BC Managed Care – PPO | Admitting: Obstetrics & Gynecology

## 2023-02-28 VITALS — BP 110/79 | HR 68 | Ht 66.0 in | Wt 163.6 lb

## 2023-02-28 DIAGNOSIS — N340 Urethral abscess: Secondary | ICD-10-CM

## 2023-02-28 NOTE — Progress Notes (Signed)
GYNECOLOGY  VISIT  CC:   follow up  HPI: 52 y.o. G2P2000 [name] here for complaint of possible ruptures Bartholin's cyst that started as some pain with intercourse one week ago.  This worsened and she started feeling a bulge that was about the size of a lima bean.  She was traveling to [place] and couldn't come in.  She used warm washclothes and was started on antibiotics because she was traveling.  Reports the area ruptured around 11am on Saturday and it has gotten a lot better since.  Never ran a fever.  She feels basically back to normal now.  She thinks this happened once before but it was smaller and soaking in epson salt baths made it go away.   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     cephALEXin (KEFLEX) 500 MG capsule Take 1 capsule (500 mg total) by mouth 4 (four) times daily. 28 capsule 0   COVID-19 mRNA bivalent vaccine, Pfizer, (PFIZER COVID-19 VAC BIVALENT) injection Inject into the muscle. 0.3 mL 0   FLUoxetine (PROZAC) 20 MG tablet Take 20 mg by mouth daily.     levothyroxine (SYNTHROID) 150 MCG tablet Take 150 mcg by mouth daily before breakfast. Take 5 days a week     norethindrone (HEATHER) 0.35 MG tablet Take 1 tablet (0.35 mg total) by mouth daily. 84 tablet 3   No current facility-administered medications on file prior to visit.    ALLERGIES: Doxycycline  SH:  married, non smoker  Review of Systems  Constitutional: Negative.   Genitourinary: Negative.     PHYSICAL  EXAMINATION:    BP 110/79 (BP Location: Right Arm, Patient Position: Sitting, Cuff Size: Normal)   Pulse 68   Ht 5\' 6"  (1.676 m) Comment: Reported  Wt 163 lb 9.6 oz (74.2 kg)   BMI 26.41 kg/m     General appearance: alert, cooperative and appears stated age Physical Exam Constitutional:      Appearance: Normal appearance.  Genitourinary:   Lymphadenopathy:     Lower Body: No right inguinal adenopathy. No left inguinal adenopathy.  Neurological:     Mental Status: She is alert.     Assessment/Plan: 1. Skene's gland abscess - communicated with Dr. Fonnie Birkenhead, Urogyn for colleague in [place] area.  Referral will be placed.   - Ambulatory referral to Urogynecology  2. Periurethral abscess - will order MRI to completely diagnose location and whether this is skene's gland or urethral diverticulum - MR PELVIS W WO CONTRAST; Future  Total time with pt:  

## 2023-03-07 ENCOUNTER — Other Ambulatory Visit (HOSPITAL_BASED_OUTPATIENT_CLINIC_OR_DEPARTMENT_OTHER): Payer: Self-pay | Admitting: Obstetrics & Gynecology

## 2023-03-07 DIAGNOSIS — N340 Urethral abscess: Secondary | ICD-10-CM

## 2023-03-12 ENCOUNTER — Ambulatory Visit (HOSPITAL_BASED_OUTPATIENT_CLINIC_OR_DEPARTMENT_OTHER): Payer: BC Managed Care – PPO | Admitting: Obstetrics & Gynecology

## 2023-04-03 ENCOUNTER — Encounter (HOSPITAL_BASED_OUTPATIENT_CLINIC_OR_DEPARTMENT_OTHER): Payer: Self-pay | Admitting: Obstetrics & Gynecology

## 2023-08-09 ENCOUNTER — Ambulatory Visit (HOSPITAL_BASED_OUTPATIENT_CLINIC_OR_DEPARTMENT_OTHER): Payer: BC Managed Care – PPO | Admitting: Obstetrics & Gynecology

## 2023-08-19 ENCOUNTER — Encounter (HOSPITAL_BASED_OUTPATIENT_CLINIC_OR_DEPARTMENT_OTHER): Payer: Self-pay | Admitting: Obstetrics & Gynecology

## 2023-08-28 ENCOUNTER — Ambulatory Visit (HOSPITAL_BASED_OUTPATIENT_CLINIC_OR_DEPARTMENT_OTHER): Payer: BC Managed Care – PPO | Admitting: Obstetrics & Gynecology

## 2023-09-17 ENCOUNTER — Other Ambulatory Visit (HOSPITAL_BASED_OUTPATIENT_CLINIC_OR_DEPARTMENT_OTHER): Payer: Self-pay | Admitting: Obstetrics & Gynecology

## 2023-09-17 DIAGNOSIS — N912 Amenorrhea, unspecified: Secondary | ICD-10-CM

## 2023-11-13 ENCOUNTER — Ambulatory Visit (HOSPITAL_BASED_OUTPATIENT_CLINIC_OR_DEPARTMENT_OTHER): Payer: BC Managed Care – PPO | Admitting: Obstetrics & Gynecology

## 2023-11-13 ENCOUNTER — Encounter (HOSPITAL_BASED_OUTPATIENT_CLINIC_OR_DEPARTMENT_OTHER): Payer: Self-pay | Admitting: Obstetrics & Gynecology

## 2023-11-13 VITALS — BP 131/81 | HR 66 | Ht 66.5 in | Wt 162.0 lb

## 2023-11-13 DIAGNOSIS — N6321 Unspecified lump in the left breast, upper outer quadrant: Secondary | ICD-10-CM

## 2023-11-13 DIAGNOSIS — E890 Postprocedural hypothyroidism: Secondary | ICD-10-CM

## 2023-11-13 DIAGNOSIS — R790 Abnormal level of blood mineral: Secondary | ICD-10-CM

## 2023-11-13 DIAGNOSIS — N912 Amenorrhea, unspecified: Secondary | ICD-10-CM

## 2023-11-13 DIAGNOSIS — E785 Hyperlipidemia, unspecified: Secondary | ICD-10-CM

## 2023-11-13 DIAGNOSIS — Z01419 Encounter for gynecological examination (general) (routine) without abnormal findings: Secondary | ICD-10-CM | POA: Diagnosis not present

## 2023-11-13 MED ORDER — BUPROPION HCL ER (XL) 150 MG PO TB24: 150.0000 mg | ORAL_TABLET | Freq: Every day | ORAL | Status: AC

## 2023-11-13 NOTE — Progress Notes (Signed)
53 y.o. G76P2000 [name] here for annual exam.  Doing well.  On Zepbound and this is managed by Dani Gobble.    Has questions about 3D mammograms.  Is having difficulty getting this scheduled.  Enrolled in study where having 3D was possible but not assigned to that arm.  Discussed scheduling in this region and likely in [place].  Just need to order and send to imaging location where 3D is done.  This is what she would like to do.    Would like blood work done today.  Discussed what she desires to have tested.  Pt on POPs.  Has no bleeding.  Will check [facility] today as well.  Also feeling area in left breast she wants me to check.    No LMP recorded.          Sexually active: Yes.    The current method of family planning is POPs.      Health Maintenance: Pap:  07/27/2022 Negative History of abnormal Pap:  no MMG: 07/05/2023 Colonoscopy:  09/08/2021 at [facility] BMD:   not indicated at this time Screening Labs: ordered today   reports that she has never smoked. She has never used smokeless tobacco. She reports current alcohol use of about 2.0 standard drinks of alcohol per week. She reports that she does not use drugs.  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 Luna Fuse at [facility] , repeat 3 yrs    COLONOSCOPY WITH PROPOFOL N/A 06/29/2015   Procedure: COLONOSCOPY WITH PROPOFOL;  Surgeon: Scot Jun, MD;  Location: [facility] ENDOSCOPY;  Service: Endoscopy;  Laterality: N/A;   THYROIDECTOMY  08/29/2002   [place]     Current Outpatient Medications  Medication Sig Dispense Refill   buPROPion (WELLBUTRIN XL) 300 MG 24 hr tablet Take by mouth.     COVID-19 mRNA bivalent vaccine, Pfizer, (PFIZER COVID-19 VAC BIVALENT) injection Inject into the muscle. 0.3 mL 0   FLUoxetine (PROZAC) 20 MG tablet Take 20 mg by mouth daily.     levothyroxine (SYNTHROID) 150 MCG tablet Take 150 mcg by mouth daily before breakfast. Take 5 days a week     norethindrone (MICRONOR) 0.35 MG tablet Take 1 tablet (0.35 mg total) by mouth daily. 84 tablet 0   No current facility-administered medications for this visit.    Family History  Problem Relation Age of Onset   ALS Mother 89       died age 79   Cancer Mother        Spinal Cord   Hypertension Father    Parkinson's disease Maternal Grandmother    Throat cancer Maternal Grandmother        smoker   Colon cancer Other 8       maternal uncle   Lung cancer Maternal Grandfather        smoker   Breast cancer Other        9--maternal great aunts (maternal grandfather's sisters)  ROS: Constitutional: negative Genitourinary:negative  Exam:   BP 131/81 (BP Location: Left Arm, Patient Position: Sitting, Cuff Size: Large)   Pulse 66   Ht 5' 6.5" (1.689 m)   Wt 162 lb (73.5 kg)   BMI 25.76 kg/m   Height: 5' 6.5" (168.9 cm)  General appearance: alert, cooperative and appears stated age Head: Normocephalic, without obvious abnormality, atraumatic Neck: no adenopathy, supple, symmetrical, trachea midline and thyroid normal to inspection and palpation Lungs: clear to auscultation bilaterally Breasts: normal appearance, no masses or tenderness in right breast, left breast with irregularity in left upper quadrant, not a discrete mass Heart: regular rate and rhythm Abdomen: soft, non-tender; bowel sounds normal; no masses,  no organomegaly Extremities: extremities normal, atraumatic, no cyanosis or edema Skin: Skin color, texture, turgor normal. No rashes or  lesions Lymph nodes: Cervical, supraclavicular, and axillary nodes normal. No abnormal inguinal nodes palpated Neurologic: Grossly normal   Pelvic: External genitalia:  no lesions              Urethra:  normal appearing urethra with no masses, tenderness or lesions              Bartholins and Skenes: normal                 Vagina: normal appearing vagina with normal color and no discharge, no lesions              Cervix: no lesions              Pap taken: No. Bimanual Exam:  Uterus:  normal size, contour, position, consistency, mobility, non-tender              Adnexa: normal adnexa and no mass, fullness, tenderness               Rectovaginal: Confirms               Anus:  normal sphincter tone, no lesions  Chaperone, Ina Homes, CMA, was present for exam.  Assessment/Plan: 1. Well woman exam with routine gynecological exam (Primary) - Pap smear 07/27/2022 - Mammogram 07/05/2023 - Colonoscopy 09/08/2021 - Bone mineral density guidelines reviewed - lab work ordered per below - vaccines reviewed/updated  2. Amenorrhea - Follicle stimulating hormone  3. Postoperative hypothyroidism - TSH  4. Low ferritin - CBC - Iron, TIBC and Ferritin Panel  5. Elevated lipids - Comprehensive metabolic panel - Hemoglobin A1c - Lipid panel  6. Mass of upper outer quadrant of left breast - MM 3D DIAGNOSTIC MAMMOGRAM UNILATERAL LEFT BREAST; Future - [place] LIMITED ULTRASOUND INCLUDING AXILLA LEFT BREAST ; Future

## 2023-11-14 ENCOUNTER — Encounter (HOSPITAL_BASED_OUTPATIENT_CLINIC_OR_DEPARTMENT_OTHER): Payer: Self-pay | Admitting: Obstetrics & Gynecology

## 2023-11-14 LAB — IRON,TIBC AND FERRITIN PANEL
Ferritin: 131 ng/mL (ref 15–150)
Iron Saturation: 36 % (ref 15–55)
Iron: 108 ug/dL (ref 27–159)
Total Iron Binding Capacity: 301 ug/dL (ref 250–450)
UIBC: 193 ug/dL (ref 131–425)

## 2023-11-14 LAB — COMPREHENSIVE METABOLIC PANEL
ALT: 27 [IU]/L (ref 0–32)
AST: 19 [IU]/L (ref 0–40)
Albumin: 4.5 g/dL (ref 3.8–4.9)
Alkaline Phosphatase: 43 [IU]/L — ABNORMAL LOW (ref 44–121)
BUN/Creatinine Ratio: 13 (ref 9–23)
BUN: 11 mg/dL (ref 6–24)
Bilirubin Total: 0.4 mg/dL (ref 0.0–1.2)
CO2: 22 mmol/L (ref 20–29)
Calcium: 9.2 mg/dL (ref 8.7–10.2)
Chloride: 101 mmol/L (ref 96–106)
Creatinine, Ser: 0.83 mg/dL (ref 0.57–1.00)
Globulin, Total: 2.4 g/dL (ref 1.5–4.5)
Glucose: 73 mg/dL (ref 70–99)
Potassium: 4 mmol/L (ref 3.5–5.2)
Sodium: 137 mmol/L (ref 134–144)
Total Protein: 6.9 g/dL (ref 6.0–8.5)
eGFR: 84 mL/min/{1.73_m2} (ref >59)

## 2023-11-14 LAB — HEMOGLOBIN A1C
Est. average glucose Bld gHb Est-mCnc: 103 mg/dL
Hgb A1c MFr Bld: 5.2 % (ref 4.8–5.6)

## 2023-11-14 LAB — CBC
Hematocrit: 41.3 % (ref 34.0–46.6)
Hemoglobin: 13.5 g/dL (ref 11.1–15.9)
MCH: 29.9 pg (ref 26.6–33.0)
MCHC: 32.7 g/dL (ref 31.5–35.7)
MCV: 92 fL (ref 79–97)
Platelets: 382 10*3/uL (ref 150–450)
RBC: 4.51 x10E6/uL (ref 3.77–5.28)
RDW: 12 % (ref 11.7–15.4)
WBC: 6.1 10*3/uL (ref 3.4–10.8)

## 2023-11-14 LAB — LIPID PANEL
Chol/HDL Ratio: 3.9 {ratio} (ref 0.0–4.4)
Cholesterol, Total: 239 mg/dL — ABNORMAL HIGH (ref 100–199)
HDL: 61 mg/dL (ref >39)
LDL Chol Calc (NIH): 163 mg/dL — ABNORMAL HIGH (ref 0–99)
Triglycerides: 87 mg/dL (ref 0–149)
VLDL Cholesterol Cal: 15 mg/dL (ref 5–40)

## 2023-11-14 LAB — FOLLICLE STIMULATING HORMONE: FSH: 7.1 m[IU]/mL

## 2023-11-14 LAB — TSH: TSH: 1.86 u[IU]/mL (ref 0.450–4.500)

## 2023-11-16 ENCOUNTER — Encounter (HOSPITAL_BASED_OUTPATIENT_CLINIC_OR_DEPARTMENT_OTHER): Payer: Self-pay | Admitting: Obstetrics & Gynecology

## 2023-11-16 ENCOUNTER — Other Ambulatory Visit (HOSPITAL_BASED_OUTPATIENT_CLINIC_OR_DEPARTMENT_OTHER): Payer: Self-pay | Admitting: Obstetrics & Gynecology

## 2023-11-16 DIAGNOSIS — N912 Amenorrhea, unspecified: Secondary | ICD-10-CM

## 2023-11-16 DIAGNOSIS — R790 Abnormal level of blood mineral: Secondary | ICD-10-CM

## 2023-11-16 MED ORDER — NORETHINDRONE 0.35 MG PO TABS: 1.0000 | ORAL_TABLET | Freq: Every day | ORAL | 3 refills | Status: DC

## 2024-06-11 ENCOUNTER — Encounter (HOSPITAL_BASED_OUTPATIENT_CLINIC_OR_DEPARTMENT_OTHER): Payer: Self-pay | Admitting: Obstetrics & Gynecology

## 2024-06-11 DIAGNOSIS — Z1231 Encounter for screening mammogram for malignant neoplasm of breast: Secondary | ICD-10-CM

## 2024-10-09 ENCOUNTER — Encounter (HOSPITAL_BASED_OUTPATIENT_CLINIC_OR_DEPARTMENT_OTHER): Payer: Self-pay | Admitting: Obstetrics & Gynecology

## 2024-11-19 NOTE — Progress Notes (Signed)
 "  ANNUAL EXAM Patient name: Nichole Flores MRN [id number]  Date of birth: 03-11-1971 Chief Complaint:   Gynecologic Exam  History of Present Illness:   Nichole Flores is a 54 y.o. G45P2000 Caucasian female being seen today for a routine annual exam.  Denies vaginal bleeding.  Still on POP.  Would like to repeat [facility] today.  H/o low ferritin which has improved with no bleeding (due to POP) and also iron.  Will repeat today.  Also needs TSH done.  Last pap 07/27/2022. Results were: NILM w/ HRHPV negative. H/O abnormal pap: no Last mammogram: 07/30/2024. Results were: normal. Family h/o breast cancer: yes maternal great aunt Last colonoscopy: 09/08/2021. Results were: normal. Family h/o colorectal cancer: yes maternal uncle     11/20/2024    8:29 AM 11/13/2023   11:57 AM 02/28/2023    1:28 PM 07/27/2022    8:35 AM 07/20/2021    3:06 PM  Depression screen PHQ 2/9  Decreased Interest 0 0 0 0 0  Down, Depressed, Hopeless 0 0 0 0 0  PHQ - 2 Score 0 0 0 0 0    Review of Systems:   Pertinent items are noted in HPI Denies any bowel changes and no urinary concerns.  Denies pelvic pain.   Pertinent History Reviewed:  Reviewed past medical,surgical, social and family history.  Reviewed problem list, medications and allergies. Physical Assessment:   Vitals:   11/20/24 0823  BP: 114/84  Pulse: 77  SpO2: 100%  Weight: 159 lb 9.6 oz (72.4 kg)  Height: 5' 6 (1.676 m)  Body mass index is 25.76 kg/m.        Physical Examination:   General appearance - well appearing, and in no distress  Mental status - alert, oriented to person, place, and time  Psych:  She has a normal mood and affect  Skin - warm and dry, normal color, no suspicious lesions noted  Chest - effort normal, all lung fields clear to auscultation bilaterally  Heart - normal rate and regular rhythm  Neck:  midline trachea, no thyromegaly or nodules  Breasts - breasts appear normal, no suspicious masses, no skin or nipple  changes or  axillary nodes  Abdomen - soft, nontender, nondistended, no masses or organomegaly  Pelvic - VULVA: normal appearing vulva with no masses, tenderness or lesions   VAGINA: normal appearing vagina with normal color and discharge, no lesions  CERVIX: normal appearing cervix without discharge or lesions, no CMT  Thin prep pap is not indicated  UTERUS: uterus is felt to be normal size, shape, consistency and nontender   ADNEXA: No adnexal masses or tenderness noted.  Rectal - normal rectal, good sphincter tone, no masses felt  Extremities:  No swelling or varicosities noted  Chaperone present for exam  No results found for this or any previous visit (from the past 24 hours).  Assessment & Plan:  1. Well woman exam with routine gynecological exam (Primary) - Pap smear 07/27/2022.  Not indicated today. - Mammogram 07/30/2024 - Colonoscopy 09/08/2021 - lab work ordered per below - vaccines reviewed/updated  2. Amenorrhea - will recheck [facility] and send rx for micronor .  If FSH elevated, can stop micronor . - Follicle stimulating hormone - norethindrone  (MICRONOR ) 0.35 MG tablet; Take 1 tablet (0.35 mg total) by mouth daily.  Dispense: 84 tablet; Refill: 3  3. Low ferritin - Ferritin  4. Acquired hypothyroidism - TSH  5. History of thyroid  cancer  6. Family hx of colon cancer  Orders Placed This Encounter  Procedures   Follicle stimulating hormone   Ferritin   TSH    Meds:  Meds ordered this encounter  Medications   norethindrone  (MICRONOR ) 0.35 MG tablet    Sig: Take 1 tablet (0.35 mg total) by mouth daily.    Dispense:  84 tablet    Refill:  3    Follow-up: Return in about 1 year (around 11/20/2025).  Ronal GORMAN Pinal, MD 11/21/2024 4:55 PM "

## 2024-11-20 ENCOUNTER — Encounter (HOSPITAL_BASED_OUTPATIENT_CLINIC_OR_DEPARTMENT_OTHER): Payer: Self-pay | Admitting: Obstetrics & Gynecology

## 2024-11-20 ENCOUNTER — Ambulatory Visit (INDEPENDENT_AMBULATORY_CARE_PROVIDER_SITE_OTHER): Payer: BC Managed Care – PPO | Admitting: Obstetrics & Gynecology

## 2024-11-20 VITALS — BP 114/84 | HR 77 | Ht 66.0 in | Wt 159.6 lb

## 2024-11-20 DIAGNOSIS — N912 Amenorrhea, unspecified: Secondary | ICD-10-CM | POA: Diagnosis not present

## 2024-11-20 DIAGNOSIS — R790 Abnormal level of blood mineral: Secondary | ICD-10-CM

## 2024-11-20 DIAGNOSIS — E039 Hypothyroidism, unspecified: Secondary | ICD-10-CM

## 2024-11-20 DIAGNOSIS — Z1331 Encounter for screening for depression: Secondary | ICD-10-CM | POA: Diagnosis not present

## 2024-11-20 DIAGNOSIS — Z85850 Personal history of malignant neoplasm of thyroid: Secondary | ICD-10-CM | POA: Diagnosis not present

## 2024-11-20 DIAGNOSIS — Z01419 Encounter for gynecological examination (general) (routine) without abnormal findings: Secondary | ICD-10-CM

## 2024-11-20 DIAGNOSIS — Z800 Family history of malignant neoplasm of digestive organs: Secondary | ICD-10-CM

## 2024-11-20 LAB — FERRITIN: Ferritin: 117 ng/mL (ref 15–150)

## 2024-11-20 LAB — FOLLICLE STIMULATING HORMONE: FSH: 8.2 m[IU]/mL

## 2024-11-20 LAB — TSH: TSH: 0.925 u[IU]/mL (ref 0.450–4.500)

## 2024-11-20 MED ORDER — NORETHINDRONE 0.35 MG PO TABS: 1.0000 | ORAL_TABLET | Freq: Every day | ORAL | 3 refills | Status: AC

## 2024-11-21 ENCOUNTER — Ambulatory Visit (HOSPITAL_BASED_OUTPATIENT_CLINIC_OR_DEPARTMENT_OTHER): Payer: Self-pay | Admitting: Obstetrics & Gynecology
